# Patient Record
Sex: Female | Born: 1970 | Race: White | Hispanic: No | Marital: Married | Smoking: Never smoker
Health system: Southern US, Community
[De-identification: ages and names within clinical notes are randomized; demographics above are authoritative.]

## PROBLEM LIST (undated history)

## (undated) DIAGNOSIS — R7303 Prediabetes: Secondary | ICD-10-CM

## (undated) DIAGNOSIS — I639 Cerebral infarction, unspecified: Secondary | ICD-10-CM

## (undated) DIAGNOSIS — I10 Essential (primary) hypertension: Secondary | ICD-10-CM

## (undated) DIAGNOSIS — I1310 Hypertensive heart and chronic kidney disease without heart failure, with stage 1 through stage 4 chronic kidney disease, or unspecified chronic kidney disease: Secondary | ICD-10-CM

## (undated) DIAGNOSIS — I69959 Hemiplegia and hemiparesis following unspecified cerebrovascular disease affecting unspecified side: Secondary | ICD-10-CM

## (undated) DIAGNOSIS — G2581 Restless legs syndrome: Secondary | ICD-10-CM

## (undated) DIAGNOSIS — Z8679 Personal history of other diseases of the circulatory system: Secondary | ICD-10-CM

## (undated) DIAGNOSIS — F419 Anxiety disorder, unspecified: Secondary | ICD-10-CM

## (undated) DIAGNOSIS — E785 Hyperlipidemia, unspecified: Secondary | ICD-10-CM

## (undated) DIAGNOSIS — R269 Unspecified abnormalities of gait and mobility: Secondary | ICD-10-CM

## (undated) HISTORY — DX: Hypertensive heart and chronic kidney disease without heart failure, with stage 1 through stage 4 chronic kidney disease, or unspecified chronic kidney disease: I13.10

## (undated) HISTORY — DX: Hemiplegia and hemiparesis following unspecified cerebrovascular disease affecting unspecified side: I69.959

## (undated) HISTORY — DX: Cerebral infarction, unspecified: I63.9

## (undated) HISTORY — DX: Personal history of other diseases of the circulatory system: Z86.79

## (undated) HISTORY — DX: Restless legs syndrome: G25.81

## (undated) HISTORY — DX: Hyperlipidemia, unspecified: E78.5

## (undated) HISTORY — DX: Anxiety disorder, unspecified: F41.9

## (undated) HISTORY — PX: TENDON REPAIR: SHX5111

## (undated) HISTORY — DX: Unspecified abnormalities of gait and mobility: R26.9

## (undated) HISTORY — DX: Essential (primary) hypertension: I10

## (undated) HISTORY — DX: Prediabetes: R73.03

---

## 1999-09-27 ENCOUNTER — Other Ambulatory Visit: Admission: RE | Admit: 1999-09-27 | Discharge: 1999-09-27 | Payer: Self-pay | Admitting: Obstetrics and Gynecology

## 1999-12-20 ENCOUNTER — Ambulatory Visit (HOSPITAL_COMMUNITY): Admission: RE | Admit: 1999-12-20 | Discharge: 1999-12-20 | Payer: Self-pay | Admitting: Obstetrics and Gynecology

## 1999-12-20 ENCOUNTER — Encounter: Payer: Self-pay | Admitting: Obstetrics and Gynecology

## 2000-02-29 ENCOUNTER — Inpatient Hospital Stay (HOSPITAL_COMMUNITY): Admission: AD | Admit: 2000-02-29 | Discharge: 2000-02-29 | Payer: Self-pay | Admitting: Obstetrics and Gynecology

## 2000-03-24 ENCOUNTER — Inpatient Hospital Stay (HOSPITAL_COMMUNITY): Admission: AD | Admit: 2000-03-24 | Discharge: 2000-03-30 | Payer: Self-pay | Admitting: Obstetrics and Gynecology

## 2000-04-09 ENCOUNTER — Encounter: Admission: RE | Admit: 2000-04-09 | Discharge: 2000-05-27 | Payer: Self-pay | Admitting: Obstetrics and Gynecology

## 2000-05-07 ENCOUNTER — Other Ambulatory Visit: Admission: RE | Admit: 2000-05-07 | Discharge: 2000-05-07 | Payer: Self-pay | Admitting: Obstetrics and Gynecology

## 2001-01-14 ENCOUNTER — Encounter: Admission: RE | Admit: 2001-01-14 | Discharge: 2001-01-14 | Payer: Self-pay | Admitting: *Deleted

## 2001-01-14 ENCOUNTER — Encounter: Payer: Self-pay | Admitting: *Deleted

## 2001-04-06 ENCOUNTER — Encounter: Admission: RE | Admit: 2001-04-06 | Discharge: 2001-05-13 | Payer: Self-pay | Admitting: Internal Medicine

## 2001-04-15 ENCOUNTER — Other Ambulatory Visit: Admission: RE | Admit: 2001-04-15 | Discharge: 2001-04-15 | Payer: Self-pay | Admitting: Obstetrics & Gynecology

## 2001-12-11 ENCOUNTER — Encounter: Admission: RE | Admit: 2001-12-11 | Discharge: 2001-12-11 | Payer: Self-pay | Admitting: Obstetrics & Gynecology

## 2001-12-11 ENCOUNTER — Encounter: Payer: Self-pay | Admitting: Obstetrics & Gynecology

## 2002-12-28 ENCOUNTER — Other Ambulatory Visit: Admission: RE | Admit: 2002-12-28 | Discharge: 2002-12-28 | Payer: Self-pay | Admitting: Obstetrics & Gynecology

## 2003-02-09 ENCOUNTER — Inpatient Hospital Stay (HOSPITAL_COMMUNITY): Admission: AD | Admit: 2003-02-09 | Discharge: 2003-02-09 | Payer: Self-pay | Admitting: Obstetrics and Gynecology

## 2003-10-28 ENCOUNTER — Ambulatory Visit (HOSPITAL_COMMUNITY): Admission: RE | Admit: 2003-10-28 | Discharge: 2003-10-28 | Payer: Self-pay | Admitting: Pediatrics

## 2004-02-21 ENCOUNTER — Other Ambulatory Visit: Admission: RE | Admit: 2004-02-21 | Discharge: 2004-02-21 | Payer: Self-pay | Admitting: Obstetrics & Gynecology

## 2004-10-09 ENCOUNTER — Ambulatory Visit (HOSPITAL_COMMUNITY)
Admission: RE | Admit: 2004-10-09 | Discharge: 2004-10-09 | Payer: Self-pay | Admitting: Orthodontics and Dentofacial Orthopedics

## 2005-04-29 ENCOUNTER — Other Ambulatory Visit: Admission: RE | Admit: 2005-04-29 | Discharge: 2005-04-29 | Payer: Self-pay | Admitting: Obstetrics & Gynecology

## 2005-05-27 ENCOUNTER — Ambulatory Visit (HOSPITAL_COMMUNITY): Admission: RE | Admit: 2005-05-27 | Discharge: 2005-05-27 | Payer: Self-pay | Admitting: Gastroenterology

## 2005-07-02 ENCOUNTER — Ambulatory Visit (HOSPITAL_COMMUNITY): Admission: RE | Admit: 2005-07-02 | Discharge: 2005-07-02 | Payer: Self-pay | Admitting: Gastroenterology

## 2006-04-05 ENCOUNTER — Inpatient Hospital Stay (HOSPITAL_COMMUNITY): Admission: AD | Admit: 2006-04-05 | Discharge: 2006-04-05 | Payer: Self-pay | Admitting: Obstetrics and Gynecology

## 2006-04-28 ENCOUNTER — Inpatient Hospital Stay (HOSPITAL_COMMUNITY): Admission: AD | Admit: 2006-04-28 | Discharge: 2006-05-01 | Payer: Self-pay | Admitting: Obstetrics & Gynecology

## 2006-05-02 ENCOUNTER — Encounter: Admission: RE | Admit: 2006-05-02 | Discharge: 2006-05-28 | Payer: Self-pay | Admitting: Obstetrics & Gynecology

## 2007-04-02 ENCOUNTER — Inpatient Hospital Stay (HOSPITAL_COMMUNITY): Admission: AD | Admit: 2007-04-02 | Discharge: 2007-04-02 | Payer: Self-pay | Admitting: Obstetrics and Gynecology

## 2007-06-18 DIAGNOSIS — I639 Cerebral infarction, unspecified: Secondary | ICD-10-CM

## 2007-06-18 HISTORY — DX: Cerebral infarction, unspecified: I63.9

## 2007-10-05 ENCOUNTER — Encounter (INDEPENDENT_AMBULATORY_CARE_PROVIDER_SITE_OTHER): Payer: Self-pay | Admitting: Obstetrics and Gynecology

## 2007-10-05 ENCOUNTER — Inpatient Hospital Stay (HOSPITAL_COMMUNITY): Admission: AD | Admit: 2007-10-05 | Discharge: 2007-10-09 | Payer: Self-pay | Admitting: Obstetrics and Gynecology

## 2007-12-17 ENCOUNTER — Ambulatory Visit (HOSPITAL_COMMUNITY): Admission: RE | Admit: 2007-12-17 | Discharge: 2007-12-17 | Payer: Self-pay | Admitting: Internal Medicine

## 2009-06-21 ENCOUNTER — Encounter: Admission: RE | Admit: 2009-06-21 | Discharge: 2009-07-20 | Payer: Self-pay | Admitting: Neurology

## 2010-02-13 ENCOUNTER — Ambulatory Visit (HOSPITAL_COMMUNITY): Admission: RE | Admit: 2010-02-13 | Discharge: 2010-02-13 | Payer: Self-pay | Admitting: Internal Medicine

## 2010-07-08 ENCOUNTER — Encounter: Payer: Self-pay | Admitting: Orthodontics and Dentofacial Orthopedics

## 2010-10-30 NOTE — Op Note (Signed)
NAME:  Andrea Cameron, Andrea Cameron                ACCOUNT NO.:  [id number]   MEDICAL RECORD NO.:  [id number]          PATIENT TYPE:  INP   LOCATION:  9118                          FACILITY:  [facility]   PHYSICIAN:  Guy Sandifer. Henderson Cloud, M.D. DATE OF BIRTH:  1970/12/30   DATE OF PROCEDURE:  10/05/2007  DATE OF DISCHARGE:                               OPERATIVE REPORT   PREOPERATIVE DIAGNOSES:  1. Chronic hypertension.  2. Previous cesarean section.  3. Desires permanent sterilization.   POSTOPERATIVE DIAGNOSES:  1. Chronic hypertension.  2. Previous cesarean section.  3. Desires permanent sterilization.   PROCEDURE:  Repeat low transverse cesarean section and bilateral tubal  ligation.   SURGEON:  Guy Sandifer. Henderson Cloud, M.D.   ANESTHESIA:  Spinal; Laqueta Due, M.D.   ESTIMATED BLOOD LOSS:  800 mL.   FINDINGS:  Viable female infant, Apgars of 9 and 9 at one five minutes.  Birth weight 7 pounds 0 ounces.  Arterial cord pH is pending.   INDICATIONS AND CONSENT:  This patient is a 40 year old married white  female, G3 P2, with an EDC of Oct 21, 2007.  Prenatal care has been  complicated by:  1) Chronic hypertension, currently on labetalol 100 mg  p.o. b.i.d.; 2) History of stroke with right-sided partial paralysis; 3)  Depression on Cymbalta the first and second trimester; 4) Previous  cesarean section x2.  The patient was scheduled for repeat C-section on  October 08, 2007.  She is sent to [facility] for evaluation of  elevated blood pressures.  Blood pressures are 140s over 90s to low 100.  Laboratories were within normal limits.  Urine protein is negative.  The  baby is reactive.  However, blood pressures remain elevated after her  usual dose of labetalol.  She did have mild headache upon presentation,  although it did resolve during the evaluation.  No changes in vision.  No epigastric pain.  Because of elevated blood pressure, after  discussing options the recommendation is made for delivery.   Potential  risks and complications are discussed preoperatively including but not  limited to infection, bleeding requiring transfusion of blood products  with possible HIV and hepatitis acquisition, organ damage, DVT, PE,  pneumonia.  The patient also requests tubal ligation.  The permanence of  the procedure, failure rate and increased ectopic risk are also  reviewed.  All questions are answered and consent is signed on the  chart.   PROCEDURE:  The patient is taken to the operating room where spinal  anesthetic is placed.  She is placed in the dorsal supine position with  a 15-degree left lateral wedge.  PAS hose are applied.  She is prepped,  a Foley catheter is placed, the bladder is drained, and she is draped in  a sterile fashion.  After testing for adequate spinal anesthesia, the  skin is entered.  The dissection is carried out in layers to the  peritoneum.  The peritoneum is incised and extended superiorly and  inferiorly.  Vesicouterine peritoneum is taken down cephalad and  laterally.  Bladder flap is developed and the bladder blade  is placed.  The uterus is incised in a low transverse manner, and the uterine cavity  is entered bluntly with a hemostat.  The uterine incision is extended  cephalad and laterally with the fingers.  Artificial rupture of  membranes for clear fluid is carried out.  Vertex is delivered, and the  oropharynx and nasopharynx are suctioned.  Baby is then delivered and  good cry and tone is noted.  Cord is clamped and cut, and the baby is  handed to the awaiting pediatrics team.  The placenta is manually  delivered and sent to Pathology.  The uterine cavity is cleaned.  The  uterus is then closed in two running locking imbricating layers of 0  Monocryl suture which achieves good hemostasis.   Tubes and ovaries are normal bilaterally.  The left fallopian tube is  identified from cornu to fimbria.  It is grasped in its mid ampullary  portion with a  Babcock clamp.  A knuckle of tube is doubly ligated with  two free ties of 0 plain suture.  The intervening knuckle is then  sharply resected.  Cautery is used to assure complete hemostasis.  A  similar procedure is carried out on the right side.  The anterior  peritoneum is closed in running fashion with 0 Monocryl suture which is  also used to reapproximate the pyramidalis muscle in the midline.  The  anterior rectus fascia is closed in running fashion with 0 PDS suture.  Skin is closed with clips.  All sponge, instrument and needle counts are  correct, and the patient is transferred to recovery room in stable  condition.      Guy Sandifer Henderson Cloud, M.D.  Electronically Signed     JET/MEDQ  D:  10/05/2007  T:  10/05/2007  Job:  [id number]

## 2010-11-02 NOTE — Discharge Summary (Signed)
[facility]  Patient:    Andrea Cameron, Andrea Cameron                       MRN: [id number] Adm. Date:  [id number] Disc. Date: [id number] Attending:  Cordelia Pen Ii Dictator:   Danie Chandler, R.N.                           Discharge Summary  ADMISSION DIAGNOSES:          1. Intrauterine pregnancy at term.                               2. Pregnancy-induced hypertension.  DISCHARGE DIAGNOSES:          1. Intrauterine pregnancy at term.                               2. Failure to progress.                               3. Finding of breech presentation.                               3. Pregnancy-induced hypertension.  PROCEDURES:                   On March 27, 2000, primary low transverse cesarean section.  REASON FOR ADMISSION:         The patient is a 40 year old, primigravida, married, white female who presented at term with induction of labor due to pregnancy-induced hypertension.  The first day of induction she failed and the second day she progressed to approximately 7 cm mild dilatation with subsequent arrest despite adequate uterine activity.  The decision was made to proceed with a primary cesarean section.  HOSPITAL COURSE:              The patient was taken to the operating room and underwent the above-named procedure without complications.  This was productive of a viable female infant with Apgars of 8 at one minute and 9 at five minutes and an arterial cord pH of 7.29.  On the day of surgery, the patient had good control of pain.  Her blood pressures were 156-164/80-90 with no CNS signs or symptoms.  Her deep tendon reflexes were 1-2+ with no clonus. On postoperative day #1, the patient had a good return of bowel function and was tolerating a regular diet.  She remained without CNS signs or symptoms. Her blood pressures were 140/84 and 140/90.  Her hemoglobin on this day was 11.5, hematocrit 33.9, and white blood cell count 13.9.  On  postoperative day #3, the patients blood pressures were 142/90-157/100.  The hemoglobin was 11.5.  She was started on Procardia XL for her increased blood pressure.  She as discharged home on postoperative day #3.  She was ambulating well without difficulty.  Blood pressures were 147/90-128/82.  The patients blood pressure was stable on Procardia XL.  DISPOSITION:                  The patient was discharged home on March 30, 2000.  CONDITION ON DISCHARGE:       Good.  DIET:  Regular as tolerated.  ACTIVITY:                     No heavy lifting.  No driving.  No vaginal entry.  FOLLOW-UP:                    She is to follow up in the office in one to two weeks for an incision check.  She is to call for temperature greater than 100 degrees, persistent nausea or vomiting, heavy vaginal bleeding, and/or redness or drainage from the incision site, as well as any CNS signs or symptoms.  DISCHARGE MEDICATIONS:        1. Prenatal vitamins one p.o. q.d.                               2. Procardia XL 30 mg one every day.                               3. Percocet as directed by M.D.                               4. Motrin as directed by M.D. DD:  04/21/00 TD:  04/21/00 Job: [id number]

## 2010-11-02 NOTE — Op Note (Signed)
NAME:  Andrea Cameron, Andrea Cameron                ACCOUNT NO.:  [id number]   MEDICAL RECORD NO.:  [id number]          PATIENT TYPE:  AMB   LOCATION:  ENDO                         FACILITY:  [facility]   PHYSICIAN:  Shirley Friar, MDDATE OF BIRTH:  [birth year]   DATE OF PROCEDURE:  07/02/2005  DATE OF DISCHARGE:                                 OPERATIVE REPORT   PROCEDURE PERFORMED:  Colonoscopy.   ENDOSCOPIST:  Shirley Friar, MD   INDICATIONS FOR PROCEDURE:  Rectal bleeding.   MEDICATIONS:  Fentanyl 50 mcg IV, Versed 5 mg IV.   FINDINGS:  Sigmoidoscope was inserted and advanced to 50 cm where a large  amount of solid stool was present and therefore prevented further safe  passage of the sigmoidoscope.  On careful withdrawal, revealed normal  appearing mucosa from 50 cm down to the rectum.  Retroflexion revealed small  internal hemorrhoids, but otherwise the sigmoidoscopy was normal.   ASSESSMENT:  Small internal hemorrhoids, otherwise normal flexible  sigmoidoscopy.   PLAN:  1.  High fiber diet.  2.  Follow-up in my office as needed.      Shirley Friar, MD  Electronically Signed     VCS/MEDQ  D:  07/02/2005  T:  07/02/2005  Job:  [id number]   cc:   Freddy Finner, M.D.  Fax: [phone]   Lucky Cowboy, M.D.  Fax: [phone]

## 2010-11-02 NOTE — Op Note (Signed)
[facility]  Patient:    Andrea Cameron, Andrea Cameron                       MRN: [id number] Proc. Date: 03/27/00 Adm. Date:  [id number] Attending:  Cordelia Pen Ii                           Operative Report  PREOPERATIVE DIAGNOSIS:       Intrauterine pregnancy, at term with induction due to pregnancy-induced hypertension, failure to progress.  POSTOPERATIVE DIAGNOSIS:      Intrauterine pregnancy, at term with induction due to pregnancy-induced hypertension, failure to progress.  Finding of breech presentation.  OPERATION:                    Low transverse cesarean section.  SURGEON:                      Juluis Mire, M.D.  ANESTHESIA:                   Epidural.  ESTIMATED BLOOD LOSS:         800 cc.  PACKS AND DRAINS:             None.  BLOOD PRODUCTS:               None.  COMPLICATIONS:                None.  INDICATIONS:                  The patient is a 40 year old primigravida, married white female, presented at term with induction labor due to pregnancy-induced hypertension.  She failed first day of induction, second day she progressed to approximately 7 cm mid dilatation with subsequent arrest despite adequate uterine activity.  The decision was made to proceed with the primary cesarean section.  The risks were discussed including the risks of infection.  The risks of hemorrhage that could necessitate transfusion, the risks of AIDS or hepatitis.  The risks of injury to adjacent organs including bladder, bowel or ureters that could require further exploratory surgery.  The patient risk of deep vein thrombosis and pulmonary embolus.  DESCRIPTION OF PROCEDURE:     The patient was taken to the operating room and placed in the supine position with left lateral tilt.  After satisfactory level of epidural anesthesia was obtained, the abdomen was prepped out with Betadine and draped as a sterile field.  A low transverse skin incision was made  with a knife and carried and carried to the to the subcutaneous tissue. The anterior rectus fascia was entered sharply and incision and fascia extended laterally.  The fascia was taken off the muscle superiorly and inferiorly.  The rectus muscles were separated in the midline.  Anterior peritoneum was entered sharply and incision to the peritoneum extended both superiorly and inferiorly.  A low transverse bladder flap was developed.  The low transverse uterine incision was begun with the knife and extended laterally using manual traction.  The infant did present at this time in the breech presentation and was delivered in the usual manner.  The infant was a viable female whose weight is still pending.  Apgars were 8/9.  Umbilical ______ pH was 7.29.  The placenta was then delivered manually.  The uterus wiped free of remaining membranes in the placenta.  Using closed  running locking suture of 0 chromic using a two layer closure technique.  Hemostasis was excellent.  Tubes and ovaries were visualized and noted to be unremarkable.  Muscle reapproximated with a running suture of 3-0 Vicryl.  The fascia closed with a running suture of 0 PDS.  The skin was closed with staples and Steri-Strips.  Sponge, needle, and instrument counts reported as correct by the circulating nurse x 2.  Foley catheter remained clear at the time of closure.  The patient tolerated the procedure well and was returned to the recovery room in good condition. DD:  03/27/00 TD:  03/28/00 Job: [id number]

## 2010-11-02 NOTE — Op Note (Signed)
NAME:  Andrea Cameron, Andrea Cameron NO.:  [id number]   MEDICAL RECORD NO.:  [id number]          PATIENT TYPE:  INP   LOCATION:  9199                          FACILITY:  [facility]   PHYSICIAN:  Freddy Finner, M.D.   DATE OF BIRTH:  December 15, 1970   DATE OF PROCEDURE:  04/28/2006  DATE OF DISCHARGE:                                 OPERATIVE REPORT   PREOPERATIVE DIAGNOSES:  1. Intrauterine pregnancy at 37-1/[redacted] weeks gestation.  2. 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   POSTOPERATIVE DIAGNOSES:  1. Intrauterine pregnancy at 37-1/[redacted] weeks gestation.  2. 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  3. Patient in labor.   PROCEDURE:  Repeat low transverse cesarean section with delivery of viable  female infant, Apgars of 9 and 9.  Arterial cord pH of 7.33, intraoperative  finding of triple nuchal cord.   SURGEON:  Freddy Finner, M.D.   ANESTHESIA:  Spinal.   ESTIMATED INTRAOPERATIVE BLOOD LOSS:  Less than or equal to 700 mL.   COMPLICATIONS:  None.   SECONDARY FINDING:  Tiny subserosal myomas x3 all measuring less than 6 mm  and left in place.   Patient is a 40 year old who was scheduled for cesarean delivery on May 05, 2006.  She presented this morning in early labor.  Based on that, we  elected to proceed with delivery at this time.  She was taken to the  operating room and there placed under adequate spinal anesthesia, placed in  the dorsal recumbent position with elevation of the right hip by 15 degrees.  The abdomen was prepped and draped in the usual fashion and Foley catheter  was placed using sterile technique.  Sterile drapes were applied.  A lower  abdominal transverse incision was made through an old scar and carried  sharply down to fascia.  Fascia was entered sharply and extended to the  extent of the skin incision.  Rectus sheath was developed  superiorly and  inferiorly with blunt and sharp dissection.  Rectus muscles were divided in  the midline.  Peritoneum was entered sharply and extended to the extent of  the skin incision with blunt dissection.  Bladder blade was placed.  Transverse incision was made in the visceral peritoneum overlying the lower  uterine segment and the bladder bluntly dissected off the lower segment.  Transverse incision was made in the lower uterine segment and extended  bluntly.  Amniotic fluid was clear.  The infant was then delivered without  difficulty with femoral compression.  Three loose loops of nuchal cord were  reduced before delivery of the shoulders.  Apgars and cord pH are noted  above.  Birth weight is unknown to me at the time of dictation.  Placenta  and other products of conception removed from the uterus after collection of  retained venous and arterial blood samples.  Uterus was delivered onto the  anterior abdominal wall.  With the exception of very small subserosal  myomas, the uterus was normal.  Tubes and ovaries were normal.  Cavity was  completely evacuated which was confirmed by manual exploration of the  uterine cavity.  Uterine incision was then closed in a double layer, running  locking 0 Monocryl was used for the first layer and an imbricating suture of  0 Monocryl for the second layer.  Bladder flap was reapproximated with an  interrupted 0 Monocryl suture.  The uterus was delivered back into the  abdominal cavity, irrigation was carried out.  Some small superficial  bleeding sources at the inferior portion of the incision were bovied with  adequate and complete hemostasis.  All pack, needle and instrument counts  were correct.  After irrigation, the abdominal incision was closed in  layers. Running 0 Monocryl was used to reapproximate the peritoneum and to  reapproximate the rectus muscles.  Fascia was closed with running 0 PDS  running from angle to angle on each side.   Bleeding subcutaneous vessels  were controlled with the Bovie.  Subcutaneous tissue was approximated with a  running 2-0 plain suture.  The skin was closed with skin staples.  Sterile  dressing was applied.  The patient was taken to the recovery room in good  condition.      Freddy Finner, M.D.  Electronically Signed     WRN/MEDQ  D:  04/28/2006  T:  04/28/2006  Job:  [id number]

## 2010-11-02 NOTE — Discharge Summary (Signed)
NAME:  Andrea Cameron, Andrea Cameron                ACCOUNT NO.:  [id number]   MEDICAL RECORD NO.:  [id number]          PATIENT TYPE:  INP   LOCATION:  9118                          FACILITY:  [facility]   PHYSICIAN:  Duke Salvia. Marcelle Overlie, M.D.DATE OF BIRTH:  09/15/1970   DATE OF ADMISSION:  10/05/2007  DATE OF DISCHARGE:  10/09/2007                               DISCHARGE SUMMARY   ADMITTING DIAGNOSES:  1. Intrauterine pregnancy at 37-5/7th weeks estimated gestational age.  2. Chronic hypertension, currently on labetalol.  3. Previous cesarean section x2.  4. History of stroke with right-sided paralysis.  5. Multiparity, desires permanent sterilization.   DISCHARGE DIAGNOSES:  1. Status post low-transverse cesarean section.  2. Viable female infant.  3. Chronic hypertension.  4. Permanent sterilization.   REASON FOR ADMISSION:  Please see written H and P.   HOSPITAL COURSE:  The patient is a 40 year old white married female  gravida 3, para 2 that was admitted to [facility] for  observation.  The patient had known chronic hypertension, currently on  labetalol.  The patient did have a history of a stroke with right-sided  partial paralysis.  The patient was scheduled for a cesarean delivery in  approximately 3 days.  However, the patient was seen in the office and  was noted to have elevation in blood pressure with blood pressure  142s/90s to below 100s.  Laboratory findings were within normal limits  and urine protein was negative.  Baby was noted to be reactive, however,  blood pressure continued to be elevated after the usual dose of  labetalol.  The patient did have a mild headache upon presentation.  She  denied any changes in vision or epigastric pain.  Due to the elevation  in blood pressure, decision was made to proceed with a repeat low-  transverse cesarean section.  The patient was then taken to the  operating room where spinal anesthesia was administered without  difficulty.  A low-transverse incision was made with delivery of a  viable female infant weighing 7 pounds 0 ounces and Apgars of 9 at 1  minute and 9 at 5 minutes.  Bilateral tubal ligation was performed  without difficulty.  The patient tolerated the procedure well and was  taken to the recovery room in stable condition.  On postoperative day  #1, the patient complained of a mild frontal headache.  She denied any  blurred vision or right upper quadrant pain.  Vital signs were stable.  She was afebrile.  Blood pressure was 134/86 to 154/98.  Deep tendon  reflexes were 3+, no clonus.  No pitting edema was noted.  Abdomen was  soft with good return of bowel function.  The patient denied any right  upper quadrant tenderness.  Fundus was firm and nontender.  Abdominal  dressing were noted to be clean, dry, and intact.  Foley was noted to  have 250 mL of urine output over the last 8 hours.  Laboratory findings  revealed hemoglobin of 8.8, platelet count of 276,000, WBC count of  16.6, potassium was 3.7.  Liver function tests on the  20th revealed AST  of 21, ALT of 19, alkaline phosphatase 105, and uric acid was 4.8.  Due  to low urine output, fluids were changed to LR and was increased to 150  mL an hour.  PIH labs were ordered.  Repeat blood pressure was noted to  be 156/103 and hydrochlorothiazide 25 mg was initiated.  On  postoperative day #2, the patient denied headache or blurred vision or  right upper quadrant pain.  She did have some dizziness on the previous  day x2.  Vital signs were stable.  Blood pressure was now 143/105 to  141/107.  Deep tendon reflexes were 2+, no clonus, no pedal edema was  observed.  Abdomen soft.  Fundus firm and nontender.  Incision was  clean, dry, and intact.  Staples were intact.  Laboratory findings  revealed hemoglobin of 8.6, platelet count of 238,000, WBC count of  15.6, AST was 24, ALT was 26, alkaline phosphatase 72, and uric acid was  5.2.  The  patient was increased on the labetalol up to 200 mg one p.o.  b.i.d., continue hydrochlorothiazide 1 p.o. daily, and ferrous sulfate  was started b.i.d. with meals.  On postoperative day #3, the patient did  complain of some dizziness.  She denied headache.  Blood pressure was  138/106, 138/97, 149/103, and 146/98.  Abdomen soft.  Fundus firm and  nontender.  Incision was clean, dry, and intact.  The patient was now  increased on labetalol up to 300 mg 3 times a day.  She is continued on  hydrochlorothiazide.  On postoperative day #4, the patient denied any  complaints.  Vital signs were stable.  Blood pressure was 129/87 to  134/72.  Abdomen soft.  Fundus firm and nontender.  Incision was clean,  dry, and intact.  Staples were removed and the patient was later  discharged home.   CONDITION ON DISCHARGE:  Stable.   DIET:  Regular as tolerated.   ACTIVITY:  No heavy lifting, no driving x2 weeks, no vaginal entry.   FOLLOWUP:  The patient is to follow up in the office in 1 week for  incision check and blood pressure check.  She is to call for temperature  greater than 100 degrees, persistent nausea and vomiting, heavy vaginal  bleeding, and/or redness or drainage from the incisional site.  The  patient was also instructed to call for headache, blurred vision,  dizziness, or right upper quadrant pain.   DISCHARGE MEDICATIONS:  1. Labetalol 300 mg 1 p.o. t.i.d.  2. Percocet 5/325 #30 one p.o. every 4 hours as needed for pain.  3. Motrin 600 mg every 6 hours p.r.n.  4. Hydrochlorothiazide 25 mg 1 p.o. daily.      Julio Sicks, N.P.      Richard M. Marcelle Overlie, M.D.  Electronically Signed    CC/MEDQ  D:  11/01/2007  T:  11/02/2007  Job:  [id number]

## 2010-11-02 NOTE — Discharge Summary (Signed)
NAME:  Andrea Cameron, Andrea Cameron                ACCOUNT NO.:  [id number]   MEDICAL RECORD NO.:  [id number]          PATIENT TYPE:  INP   LOCATION:  9124                          FACILITY:  [facility]   PHYSICIAN:  Michelle L. Grewal, M.D.DATE OF BIRTH:  23-Feb-1971   DATE OF ADMISSION:  04/28/2006  DATE OF DISCHARGE:  05/01/2006                               DISCHARGE SUMMARY   ADMITTING DIAGNOSES:  1. Intrauterine pregnancy at term.  2. Previous cesarean section, desires repeat.  3. Spontaneous onset of labor.   DISCHARGE DIAGNOSES:  1. Status post low transverse cesarean section.  2. Viable female infant.   PROCEDURE:  Repeat low transverse cesarean section.   REASON FOR ADMISSION:  Please see written H&P.   HOSPITAL COURSE:  The patient is 40 year old white married female  gravida 2, para 1 that presented to [facility] at 37-  5/7th weeks with spontaneous onset of labor.  The patient had had a  previous cesarean section for cephalopelvic disproportion and desired  repeat.  On admission, vital signs were stable.  The patient did have  chronic hypertension and currently on labetalol with blood pressure  136/98.  PIH labs had been drawn two weeks prior to admission which were  within normal limits.  The patient was then taken to the operating room  where spinal anesthesia was administered without difficulty.  A low  transverse incision was made with delivery of a viable female infant  weighing 6 pounds 15 ounces.  Apgar's at 9 at one minute and  9 at five  minutes.  Arterial cord pH was 7.33.  The patient tolerated the  procedure well and was taken to the recovery room in stable condition.  Later that day, the patient did have some elevation of blood pressure  and labetalol was now increased to 200 mg stat and then restart 100 mg  b.i.d.  After approximately one-half hour, blood pressure was recheck  with blood pressure 130/85 with good urine output.  On the following  morning, the  patient denied complaint.  She had no evidence of blurred  vision or epigastric pain or headache.  Vital signs were stable.  She is  afebrile.  Blood pressure current was 111/67.  Abdomen soft with good  return of bowel function.  Fundus is firm and nontender.  Abdominal  dressing was noted to be clean, dry and intact.  Laboratory findings  revealed hemoglobin of 9.3, platelet count of 225,000, WBC of 9.2.   On postoperative day #2, the patient was without complaint.  Vital signs  remained stable.  She is afebrile.  Fundus firm and nontender.  Abdominal dressing had been removed revealing an incision that was  clean, dry and intact.   On postoperative day #3, the patient was without complaint.  She did  desire discharge.  Vital signs were stable.  She was afebrile.  Fundus  firm and nontender.  Incision was clean, dry and intact.  Staples were  removed and the patient was discharged home.   CONDITION ON DISCHARGE:  Good.   DIET:  Regular as tolerated.  ACTIVITY:  No heavy lifting, no driving x2 weeks, no vaginal entry.   FOLLOW UP:  The patient to follow up in the office in one week for an  incision check.  She is to call for temperature greater than 100  degrees, persistent nausea, vomiting, heavy vaginal bleeding and/or  redness or drainage from the incisional site.  The patient was also  instructed to call for headache, blurred vision or right upper quadrant  pain unrelieved with Tylenol.   DISCHARGE MEDICATIONS:  1. Ibuprofen 600 mg every 6 hours.  2. Tylox one to two every 4-6 hours.  3. Cymbalta 30 mg one p.o. daily.  4. Labetalol 100 mg one p.o. b.i.d.      Julio Sicks, N.P.      Stann Mainland. Vincente Poli, M.D.  Electronically Signed    CC/MEDQ  D:  05/19/2006  T:  05/19/2006  Job:  [id number]

## 2010-11-28 ENCOUNTER — Other Ambulatory Visit: Payer: Self-pay | Admitting: Internal Medicine

## 2010-11-28 DIAGNOSIS — N644 Mastodynia: Secondary | ICD-10-CM

## 2010-12-04 ENCOUNTER — Ambulatory Visit
Admission: RE | Admit: 2010-12-04 | Discharge: 2010-12-04 | Disposition: A | Discharge status: Home / Self Care | Payer: 59 | Source: Ambulatory Visit | Attending: Internal Medicine | Admitting: Internal Medicine

## 2010-12-04 DIAGNOSIS — N644 Mastodynia: Secondary | ICD-10-CM

## 2011-03-12 LAB — COMPREHENSIVE METABOLIC PANEL
ALT: 26
AST: 21
AST: 24
Albumin: 2.5 — ABNORMAL LOW
Alkaline Phosphatase: 105
Alkaline Phosphatase: 72
CO2: 22
Calcium: 8.6
Chloride: 103
Creatinine, Ser: 0.38 — ABNORMAL LOW
Creatinine, Ser: 0.47
GFR calc Af Amer: >60
GFR calc Af Amer: >60
GFR calc non Af Amer: >60
Potassium: 3.4 — ABNORMAL LOW
Potassium: 3.7
Sodium: 133 — ABNORMAL LOW
Total Bilirubin: 0.4
Total Protein: 5.7 — ABNORMAL LOW

## 2011-03-12 LAB — URINALYSIS, ROUTINE W REFLEX MICROSCOPIC
Nitrite: NEGATIVE
Urobilinogen, UA: 0.2

## 2011-03-12 LAB — CBC
HCT: 25.2 — ABNORMAL LOW
HCT: 33.1 — ABNORMAL LOW
Hemoglobin: 11.4 — ABNORMAL LOW
MCHC: 34.9
MCHC: 35
MCV: 77.6 — ABNORMAL LOW
Platelets: 238
RBC: 3.17 — ABNORMAL LOW
RBC: 3.21 — ABNORMAL LOW
WBC: 16.6 — ABNORMAL HIGH

## 2011-03-12 LAB — RPR: RPR Ser Ql: NONREACTIVE

## 2011-03-27 LAB — HEMOGLOBIN AND HEMATOCRIT, BLOOD
HCT: 38.6
Hemoglobin: 13.1

## 2012-10-13 ENCOUNTER — Telehealth: Payer: Self-pay | Admitting: Neurology

## 2012-10-13 NOTE — Telephone Encounter (Signed)
Patient left message to get and appointment scheduled.  She was last seen 11-21-10 by Dr. Terrace Arabia.

## 2012-10-14 NOTE — Telephone Encounter (Signed)
This encounter was created in error - please disregard.

## 2012-10-26 ENCOUNTER — Encounter: Payer: Self-pay | Admitting: Neurology

## 2012-10-26 ENCOUNTER — Ambulatory Visit (INDEPENDENT_AMBULATORY_CARE_PROVIDER_SITE_OTHER): Payer: Medicare Other | Admitting: Neurology

## 2012-10-26 VITALS — Ht 64.75 in | Wt 213.0 lb

## 2012-10-26 DIAGNOSIS — R269 Unspecified abnormalities of gait and mobility: Secondary | ICD-10-CM

## 2012-10-26 DIAGNOSIS — I69959 Hemiplegia and hemiparesis following unspecified cerebrovascular disease affecting unspecified side: Secondary | ICD-10-CM

## 2012-10-26 DIAGNOSIS — I1310 Hypertensive heart and chronic kidney disease without heart failure, with stage 1 through stage 4 chronic kidney disease, or unspecified chronic kidney disease: Secondary | ICD-10-CM

## 2012-10-26 DIAGNOSIS — G8110 Spastic hemiplegia affecting unspecified side: Secondary | ICD-10-CM

## 2012-10-26 MED ORDER — ONABOTULINUMTOXINA 100 UNITS IJ SOLR: 400.0000 [IU] | Freq: Once | INTRAMUSCULAR | Status: DC

## 2012-10-28 NOTE — Progress Notes (Signed)
  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 Last injection was in June 2012. The delayed treatment was due to her insurance coverage  She had great benefit from previous injection,   she complains worsening right hand spasticity, thumb in position, right toes flexion spasticity, painful to walk on it,   She wants to focus the treatment on her right lower extremity today  Neurologic Exam  Cardiac: Regular rhythm Pulmonary: clear to auscultation bilaterally  Mental Status: Pleasant, no apparent aphasia Cranial Nerves: mild right lower face weakness. Motor: spastic right hemiparesis, right thumb in position, pronation, shoulder anterior rotation, right spastic hemicircumferential gait, right toe flexion, mild anlke plantar inversion   Assessment and Plan: EMG guided Botox injection for right hemiparesis following stroke at age 49, 29 years ago.  100 units of Botox was dissolved into 2 cc of normal saline, total of 400 unit of Botox was used.  Right adductor longus 100 units Right adductor magnus 75 units  Right flexor digitorum longus 50 units. Right tibialis posterior 50 units  Right medial gastrocnemius 50 units  Right flexor digitorum brevis 50 units Right flexor hallucis longus 25 units     She will return to clinic in 3 months for repeat injection

## 2013-02-01 ENCOUNTER — Encounter: Payer: Self-pay | Admitting: Neurology

## 2013-02-01 ENCOUNTER — Ambulatory Visit (INDEPENDENT_AMBULATORY_CARE_PROVIDER_SITE_OTHER): Payer: Medicare Other | Admitting: Neurology

## 2013-02-01 VITALS — Ht 64.0 in | Wt 212.0 lb

## 2013-02-01 DIAGNOSIS — R269 Unspecified abnormalities of gait and mobility: Secondary | ICD-10-CM

## 2013-02-01 DIAGNOSIS — G8110 Spastic hemiplegia affecting unspecified side: Secondary | ICD-10-CM

## 2013-02-01 DIAGNOSIS — I69959 Hemiplegia and hemiparesis following unspecified cerebrovascular disease affecting unspecified side: Secondary | ICD-10-CM

## 2013-02-01 MED ORDER — ONABOTULINUMTOXINA 100 UNITS IJ SOLR
600.0000 [IU] | Freq: Once | INTRAMUSCULAR | Status: AC
  Administered 2013-02-01: 600 [IU] via INTRAMUSCULAR

## 2013-02-01 NOTE — Progress Notes (Signed)
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 Last injection was in June 2012. The delayed treatment was due to her insurance coverage  She had great benefit from previous injection,   she complains worsening right hand spasticity, thumb in position, right toes flexion spasticity, painful to walk on it,   UPDATE August 2014: Last injection was in Oct 28, 2012, it was focus on her right leg, she reported 20% improvement, there was no significant side effect, today she also wants some injection to her right upper extremity as well, because of  forceful right elbow pronation, finger flexions  Neurologic Exam  Cardiac: Regular rhythm Pulmonary: clear to auscultation bilaterally  Mental Status: Pleasant, no apparent aphasia Cranial Nerves: mild right lower face weakness. Motor: spastic right hemiparesis, right thumb in position,finger flexions,  pronation, shoulder anterior rotation, right spastic hemicircumferential gait, right toe flexion, mild anlke plantar inversion   Assessment and Plan: EMG guided Botox injection for right hemiparesis following stroke at age 42, 29 years ago.  100 units of Botox was dissolved into 2 cc of normal saline, total of  600 unit of Botox was used. Lot Number [id number], Exp Feb 2017  Right flexor digitorum longus 50 unitsx2=100 units. Right tibialis posterior 50 units x2=100 units Right medial gastrocnemius 50 units x2=100 units  Right flexor digitorum brevis 50 units Right flexor hallucis longus 25 units  Right extensor hallucis longus 25 units  Right brachialis 50 units Right pronator teres 25 units. Right Flexor digitorum profundi 75 units. Right flexor digitorum superficialis 50 units    She will return to clinic in 3 months for repeat injection

## 2013-08-26 ENCOUNTER — Other Ambulatory Visit: Payer: Self-pay | Admitting: Emergency Medicine

## 2013-09-01 NOTE — Telephone Encounter (Signed)
LMOM TO CALL  

## 2013-09-07 DIAGNOSIS — R7303 Prediabetes: Secondary | ICD-10-CM | POA: Insufficient documentation

## 2013-09-07 DIAGNOSIS — E785 Hyperlipidemia, unspecified: Secondary | ICD-10-CM | POA: Insufficient documentation

## 2013-09-07 DIAGNOSIS — I639 Cerebral infarction, unspecified: Secondary | ICD-10-CM | POA: Insufficient documentation

## 2013-09-07 DIAGNOSIS — F419 Anxiety disorder, unspecified: Secondary | ICD-10-CM | POA: Insufficient documentation

## 2013-09-08 ENCOUNTER — Ambulatory Visit: Payer: Self-pay | Admitting: Emergency Medicine

## 2013-09-15 ENCOUNTER — Encounter: Payer: Self-pay | Admitting: Physician Assistant

## 2013-09-15 ENCOUNTER — Ambulatory Visit (INDEPENDENT_AMBULATORY_CARE_PROVIDER_SITE_OTHER): Payer: Medicare Other | Admitting: Physician Assistant

## 2013-09-15 VITALS — BP 120/72 | HR 72 | Temp 97.7°F | Resp 16 | Ht 64.0 in | Wt 219.0 lb

## 2013-09-15 DIAGNOSIS — R7309 Other abnormal glucose: Secondary | ICD-10-CM

## 2013-09-15 DIAGNOSIS — R7303 Prediabetes: Secondary | ICD-10-CM

## 2013-09-15 DIAGNOSIS — E785 Hyperlipidemia, unspecified: Secondary | ICD-10-CM

## 2013-09-15 DIAGNOSIS — K219 Gastro-esophageal reflux disease without esophagitis: Secondary | ICD-10-CM

## 2013-09-15 DIAGNOSIS — R21 Rash and other nonspecific skin eruption: Secondary | ICD-10-CM

## 2013-09-15 MED ORDER — ALPRAZOLAM 0.5 MG PO TABS: 0.2500 mg | ORAL_TABLET | ORAL | Status: AC | PRN

## 2013-09-15 MED ORDER — ATENOLOL 100 MG PO TABS: ORAL_TABLET | ORAL | Status: DC

## 2013-09-15 MED ORDER — CITALOPRAM HYDROBROMIDE 40 MG PO TABS: ORAL_TABLET | ORAL | Status: DC

## 2013-09-15 MED ORDER — ROPINIROLE HCL 1 MG PO TABS: 1.0000 mg | ORAL_TABLET | Freq: Three times a day (TID) | ORAL | Status: DC

## 2013-09-15 MED ORDER — KETOCONAZOLE 2 % EX CREA: 1.0000 "application " | TOPICAL_CREAM | Freq: Two times a day (BID) | CUTANEOUS | Status: DC

## 2013-09-15 NOTE — Progress Notes (Signed)
   Subjective:    Patient ID: Andrea Cameron, female    DOB: 1970-08-02, 43 y.o.   MRN: [id number]  HPI 43 y.o. female presents for RX follow up. She has a history of noncompliance and was dismissed from the practice on 09/09/2013, she is here today before she has found another physician and we are happy to see her for 30 days until that happens. She complains of a rash under left arm and AB, has been on and off for years, worse summer, worse with sun. She also states she has had abdominal pain with greasy foods, denies fever, chills, melana, diarrhea, contipation.  She would like a 90 day supply of her medications. She only takes the xanax once a week or so, only if she has any stress.    Review of Systems  Constitutional: Negative.   HENT: Negative.   Respiratory: Negative.   Cardiovascular: Negative.   Gastrointestinal: Positive for abdominal pain (with greasy food epigastric and LLQ. ). Negative for nausea, vomiting, diarrhea, constipation, blood in stool and rectal pain.  Genitourinary: Negative.   Musculoskeletal: Negative.   Skin: Positive for rash.       Objective:   Physical Exam  Constitutional: She is oriented to person, place, and time. She appears well-developed and well-nourished.  HENT:  Head: Normocephalic and atraumatic.  Eyes: Conjunctivae and EOM are normal. Pupils are equal, round, and reactive to light.  Neck: Normal range of motion. Neck supple.  Cardiovascular: Normal rate and regular rhythm.   Abdominal: Soft. She exhibits no mass. There is tenderness (epigastric). There is no rebound and no guarding.  Musculoskeletal: Normal range of motion.  Neurological: She is alert and oriented to person, place, and time.  Skin: Skin is warm and dry. Rash (dark raised smooth velvet patches along stomach, left axilla, and neck) noted.        Assessment & Plan:  Medication refill- will give 90 day and patient is encouraged to find primary care  provider GERD/Gallbladder- decrease fatty foods, offered PPI/ or ultrasound but she declined.  Rash? Tinea versicolor versus acanthis nigrans- Ketoconazole 2% cream called in and weight loss advised

## 2013-09-15 NOTE — Patient Instructions (Signed)

## 2014-01-15 ENCOUNTER — Other Ambulatory Visit: Payer: Self-pay | Admitting: Physician Assistant

## 2014-04-21 ENCOUNTER — Other Ambulatory Visit: Payer: Self-pay | Admitting: Physician Assistant

## 2014-12-02 ENCOUNTER — Other Ambulatory Visit: Payer: Self-pay | Admitting: Family Medicine

## 2014-12-02 DIAGNOSIS — Z1231 Encounter for screening mammogram for malignant neoplasm of breast: Secondary | ICD-10-CM

## 2015-07-11 ENCOUNTER — Other Ambulatory Visit: Payer: Self-pay | Admitting: Family Medicine

## 2015-07-11 DIAGNOSIS — Z1231 Encounter for screening mammogram for malignant neoplasm of breast: Secondary | ICD-10-CM

## 2015-07-11 DIAGNOSIS — Z1239 Encounter for other screening for malignant neoplasm of breast: Secondary | ICD-10-CM

## 2015-11-30 ENCOUNTER — Emergency Department (HOSPITAL_COMMUNITY)
Admission: EM | Admit: 2015-11-30 | Discharge: 2015-11-30 | Disposition: A | Discharge status: Home / Self Care | Payer: Medicare Other | Attending: Emergency Medicine | Admitting: Emergency Medicine

## 2015-11-30 ENCOUNTER — Encounter (HOSPITAL_COMMUNITY): Payer: Self-pay | Admitting: Emergency Medicine

## 2015-11-30 ENCOUNTER — Emergency Department (HOSPITAL_COMMUNITY): Payer: Medicare Other

## 2015-11-30 DIAGNOSIS — Y999 Unspecified external cause status: Secondary | ICD-10-CM | POA: Diagnosis not present

## 2015-11-30 DIAGNOSIS — Z79899 Other long term (current) drug therapy: Secondary | ICD-10-CM | POA: Diagnosis not present

## 2015-11-30 DIAGNOSIS — S92301A Fracture of unspecified metatarsal bone(s), right foot, initial encounter for closed fracture: Secondary | ICD-10-CM

## 2015-11-30 DIAGNOSIS — Y92009 Unspecified place in unspecified non-institutional (private) residence as the place of occurrence of the external cause: Secondary | ICD-10-CM | POA: Diagnosis not present

## 2015-11-30 DIAGNOSIS — Z8673 Personal history of transient ischemic attack (TIA), and cerebral infarction without residual deficits: Secondary | ICD-10-CM | POA: Diagnosis not present

## 2015-11-30 DIAGNOSIS — W1830XA Fall on same level, unspecified, initial encounter: Secondary | ICD-10-CM | POA: Insufficient documentation

## 2015-11-30 DIAGNOSIS — M545 Low back pain, unspecified: Secondary | ICD-10-CM

## 2015-11-30 DIAGNOSIS — S99921A Unspecified injury of right foot, initial encounter: Secondary | ICD-10-CM | POA: Diagnosis present

## 2015-11-30 DIAGNOSIS — E785 Hyperlipidemia, unspecified: Secondary | ICD-10-CM | POA: Insufficient documentation

## 2015-11-30 DIAGNOSIS — Z7982 Long term (current) use of aspirin: Secondary | ICD-10-CM | POA: Insufficient documentation

## 2015-11-30 DIAGNOSIS — Y9301 Activity, walking, marching and hiking: Secondary | ICD-10-CM | POA: Insufficient documentation

## 2015-11-30 DIAGNOSIS — S92354A Nondisplaced fracture of fifth metatarsal bone, right foot, initial encounter for closed fracture: Secondary | ICD-10-CM | POA: Insufficient documentation

## 2015-11-30 DIAGNOSIS — I1310 Hypertensive heart and chronic kidney disease without heart failure, with stage 1 through stage 4 chronic kidney disease, or unspecified chronic kidney disease: Secondary | ICD-10-CM | POA: Insufficient documentation

## 2015-11-30 DIAGNOSIS — N189 Chronic kidney disease, unspecified: Secondary | ICD-10-CM | POA: Insufficient documentation

## 2015-11-30 MED ORDER — DIAZEPAM 5 MG PO TABS
5.0000 mg | ORAL_TABLET | Freq: Once | ORAL | Status: AC
  Administered 2015-11-30: 5 mg via ORAL
  Filled 2015-11-30: qty 1

## 2015-11-30 MED ORDER — OXYCODONE-ACETAMINOPHEN 5-325 MG PO TABS
1.0000 | ORAL_TABLET | Freq: Once | ORAL | Status: AC
  Administered 2015-11-30: 1 via ORAL
  Filled 2015-11-30: qty 1

## 2015-11-30 MED ORDER — DIAZEPAM 5 MG PO TABS: 5.0000 mg | ORAL_TABLET | Freq: Two times a day (BID) | ORAL | Status: DC

## 2015-11-30 MED ORDER — OXYCODONE-ACETAMINOPHEN 5-325 MG PO TABS
2.0000 | ORAL_TABLET | ORAL | Status: DC | PRN
Start: 2015-11-30 — End: 2018-12-03

## 2015-11-30 MED ORDER — IBUPROFEN 600 MG PO TABS: 600.0000 mg | ORAL_TABLET | Freq: Four times a day (QID) | ORAL | Status: DC | PRN

## 2015-11-30 NOTE — ED Provider Notes (Signed)
CSN: [id number]     Arrival date & time 11/30/15  1005 History   First MD Initiated Contact with Patient 11/30/15 1425     Chief Complaint  Patient presents with  . Fall     (Consider location/radiation/quality/duration/timing/severity/associated sxs/prior Treatment) HPI   Patient is a 45 year old female with past medical history of hypertension and CVA who presents the ED with complaint of fall, onset yesterday evening. Patient reports over the past 3 months she has had back spasms which she notes is being managed by her PCP. She states she has been taking Xanaflex for her spasms which she notes make her "loopy". Patient reports taking her Xanaflex yesterday and notes when she was walking into her bedroom she lost balance resulting in her twisting her right foot and falling on the ground. Denies head injury or LOC. Patient reports since the fall she has had worsening lower back pain. Patient reports having constant spasming pain to bilateral lower back which she notes has been consistent with pain she has had over the past few months but notes it is worse. She endorses mild relief with taking Xanaflex at home. Patient reports having baseline right-sided weakness secondary to a stroke she had when she was 45 years old however she denies using any assistive devices while ambulating. Patient endorses having pain to her right ankle and reports being unable to bear weight on her right foot due to pain. Endorses associated swelling and bruising to her right foot. Pt denies fever, numbness, tingling, saddle anesthesia, loss of bowel or bladder, abdominal pain, urinary retention, weakness, IVDU, cancer or recent spinal manipulation.   Past Medical History  Diagnosis Date  . Abnormality of gait   . 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 Hemiplegia affecting dominant side, late effect of cerebrovascular  disease   . Hyperlipidemia   . Hypertension   . Anxiety   . Prediabetes   . RLS (restless legs syndrome)   . CVA (cerebral infarction) 2009   Past Surgical History  Procedure Laterality Date  . Cesarean section      x3  . Tendon repair      Right arm   Family History  Problem Relation Age of Onset  . High blood pressure    . Depression    . Anxiety disorder    . Seizures    . Stroke     Social History  Substance Use Topics  . Smoking status: Never Smoker   . Smokeless tobacco: Never Used  . Alcohol Use: 0.6 oz/week    1 Glasses of wine per week     Comment: OCC   OB History    No data available     Review of Systems  Musculoskeletal: Positive for back pain, joint swelling (right foot) and arthralgias (right foot).  Skin:       bruising  All other systems reviewed and are negative.     Allergies  Review of patient's allergies indicates no known allergies.  Home Medications   Prior to Admission medications   Medication Sig Start Date End Date Taking? Authorizing Provider  ALPRAZolam Prudy Feeler) 0.5 MG tablet Take 0.5 tablets (0.25 mg total) by mouth as needed. 09/15/13   Quentin Mulling, PA-C  aspirin 81 MG tablet Take 81 mg by mouth daily.    Historical Provider, MD  atenolol (TENORMIN) 100 MG tablet TAKE 1 TABLET BY MOUTH EVERY  DAY FOR BLOOD PRESSURE 04/22/14   Lucky CowboyWilliam McKeown, MD  citalopram (CELEXA) 40 MG tablet TAKE 1 TABLET DAILY 01/15/14   Quentin MullingAmanda Collier, PA-C  diazepam (VALIUM) 5 MG tablet Take 1 tablet (5 mg total) by mouth 2 (two) times daily. 11/30/15   Barrett HenleNicole Elizabeth Darina Hartwell, PA-C  ibuprofen (ADVIL,MOTRIN) 600 MG tablet Take 1 tablet (600 mg total) by mouth every 6 (six) hours as needed. 11/30/15   Barrett HenleNicole Elizabeth Eden Rho, PA-C  IRON CR PO Take by mouth daily.    Historical Provider, MD  ketoconazole (NIZORAL) 2 % cream Apply 1 application topically 2 (two) times daily. 09/15/13   Quentin MullingAmanda Collier, PA-C  Multiple Vitamin (MULTIVITAMINS PO) Take by mouth daily.     Historical Provider, MD  oxyCODONE-acetaminophen (PERCOCET/ROXICET) 5-325 MG tablet Take 2 tablets by mouth every 4 (four) hours as needed for severe pain. 11/30/15   Barrett HenleNicole Elizabeth Georgina Krist, PA-C  rOPINIRole (REQUIP) 1 MG tablet Take 1 tablet (1 mg total) by mouth 3 (three) times daily. 09/15/13   Quentin MullingAmanda Collier, PA-C   BP 139/92 mmHg  Pulse 61  Temp(Src) 97.8 F (36.6 C) (Oral)  Resp 20  SpO2 98%  LMP 11/23/2015 Physical Exam  Constitutional: She is oriented to person, place, and time. She appears well-developed and well-nourished. No distress.  HENT:  Head: Normocephalic and atraumatic. Head is without raccoon's eyes, without Battle's sign, without abrasion, without contusion and without laceration.  Mouth/Throat: Uvula is midline, oropharynx is clear and moist and mucous membranes are normal. No oropharyngeal exudate, posterior oropharyngeal edema, posterior oropharyngeal erythema or tonsillar abscesses.  Eyes: Conjunctivae and EOM are normal. Pupils are equal, round, and reactive to light. Right eye exhibits no discharge. Left eye exhibits no discharge. No scleral icterus.  Neck: Normal range of motion. Neck supple.  Cardiovascular: Normal rate, regular rhythm, normal heart sounds and intact distal pulses.   Pulmonary/Chest: Effort normal and breath sounds normal. No respiratory distress. She has no wheezes. She has no rales. She exhibits no tenderness.  Abdominal: Soft. Bowel sounds are normal. She exhibits no distension and no mass. There is no tenderness. There is no rebound and no guarding.  Musculoskeletal: Normal range of motion. She exhibits no edema.       Right foot: There is tenderness and swelling. There is normal range of motion, normal capillary refill, no crepitus, no deformity and no laceration.       Feet:  No midline C, T tenderness. Midline lumbar tenderness. Full range of motion of neck and dec ROM of back due to reported pain. Full range of motion of bilateral upper and  lower extremities, with 5/5 strength on left slightly dec strength noted to right which pt reports is baseline and unchanged. Sensation intact. 2+ radial and PT pulses. Cap refill <2 seconds.   Mild swelling and ecchymoses noted to right lateral forefoot, TTP. FROM of right knee, ankle and toes. 2+ DP pulse. Sensation grossly intact.   Neurological: She is alert and oriented to person, place, and time.  Skin: Skin is warm and dry. She is not diaphoretic.  Nursing note and vitals reviewed.   ED Course  Procedures (including critical care time) Labs Review Labs Reviewed - No data to display  Imaging Review Dg Lumbar Spine Complete  11/30/2015  CLINICAL DATA:  Pain and spasms in low back X 1 week, patient states she hurt her back when she was younger, Patient has little mobility. Pain in her low back when moving. EXAM: LUMBAR SPINE -  COMPLETE 4+ VIEW COMPARISON:  None. FINDINGS: No fracture.  No spondylolisthesis.  No bone lesion. Disc spaces are relatively well maintained. There are minor endplate osteophytes at L4-L5. Facet joints are well preserved. Soft tissues are unremarkable. IMPRESSION: Normal exam other than minor endplate spurring at L4-L5. Electronically Signed   By: Amie Portland M.D.   On: 11/30/2015 15:29   Dg Ankle Complete Right  11/30/2015  CLINICAL DATA:  Pt fell at home on Tuesday, her right ankle turned and she fell and is having pain on the lateral aspect of her ankle/foot, she also stated having back spasms as well, pt shielded, pt unable to dorsiflex her foot EXAM: RIGHT ANKLE - COMPLETE 3+ VIEW COMPARISON:  None. FINDINGS: No ankle fracture. The ankle mortise is normally spaced and aligned. There is a small avulsion-type fracture from the base of the fifth metatarsal which appears acute. It is nondisplaced. No other fractures. Moderate plantar calcaneal spur. IMPRESSION: 1. Small nondisplaced avulsion fracture from the base of the fifth metatarsal. 2. No other fractures.  No  dislocation. Electronically Signed   By: Amie Portland M.D.   On: 11/30/2015 11:04   I have personally reviewed and evaluated these images and lab results as part of my medical decision-making.  SPLINT APPLICATION Date/Time: 6:02 PM Authorized by: Barrett Henle Consent: Verbal consent obtained. Risks and benefits: risks, benefits and alternatives were discussed Consent given by: patient Splint applied by: orthopedic technician Location details: right foot Splint type: posterior short leg splint Post-procedure: The splinted body part was neurovascularly unchanged following the procedure. Patient tolerance: Patient tolerated the procedure well with no immediate complications.     MDM   Final diagnoses:  Metatarsal fracture, right, closed, initial encounter  Bilateral low back pain without sciatica    Patient presents with worsening back spasms and right foot pain after falling yesterday. Patient reports having chronic back spasms for the past few months which she is being managed by her PCP. She reports being on Xanaflex for her back spasms but notes it makes her loopy resulting in her having a mechanical fall. Denies head injury or LOC. No back pain red flags. VSS. Exam revealed lumbar midline tenderness, mild swelling and ecchymoses to right lateral forefoot with associated tenderness. Right lower extremity neurovascular intact. Patient given pain meds and muscle relaxant and the ED. Right ankle x-ray revealed nondisplaced avulsion fracture of base of fifth metatarsal. Lumbar spine revealed minor endplate spurring at L4-L5, otherwise unremarkable. On reevaluation patient reports her pain has significantly improved. Posterior splint applied to patient's right foot in the ED and advised pt to be non-weight bearing. Pt reports she has an wheelchair at home that she can use due to not being able to use crutches due to her residual right sided weakness from her stroke. Discussed results  and plan for discharge with patient. Patient reports she has no appointment scheduled with an orthopedist in [facility] tomorrow. I advised patient to follow up with orthopedist at her scheduled appointment. Plan to discharge patient home with pain medicine, muscle relaxant and symptomatically treatment. Advised patient to follow up with her PCP within the next week as needed for her chronic back pain. Discussed return precautions with patient.    Satira Sark [place], [place] 11/30/15 1803  Nelva Nay, MD 12/01/15 [phone]

## 2015-11-30 NOTE — ED Notes (Signed)
Placed patient into gown and on the monitor 

## 2015-11-30 NOTE — ED Notes (Signed)
Pt using restroom in waiting room.

## 2015-11-30 NOTE — Discharge Instructions (Signed)
Take your medications as prescribed. I recommend resting, elevating and icing her right foot to help with pain and swelling. Do not bear weight on your right foot until your follow up with orthopedics. You may also apply heat to your back form 15-20 minutes 3-4 times daily to help with your muscle spasms. Follow up with orthopedics at your scheduled appointment tomorrow. Please follow up with a primary care provider from the Resource Guide provided below in 1 week if your back pain has not improved. Please return to the Emergency Department if symptoms worsen or new onset of fever, numbness, tingling, groin numbness, abdominal pain, urinary retention, loss of bowel or bladder, weakness.

## 2015-11-30 NOTE — ED Notes (Signed)
Patient Alert and oriented X4. Stable and ambulatory. Patient verbalized understanding of the discharge instructions.  Patient belongings were taken by the patient.  

## 2015-11-30 NOTE — ED Notes (Signed)
Patient states she has R sided weakness secondary to stroke when she was 45 years old.   Patient states has been taking xanaflex for chronic back pain.  Patient states her back is hurting worse since she fell going to the bed from the doorway.   Patient states she normally walks okay, but the back pain made her fall.  Patient complains of R ankle pain, lower back and back spasms.

## 2015-11-30 NOTE — Progress Notes (Signed)
Orthopedic Tech Progress Note Patient Details:  Reita MayDana J Ericsson 10-29-1970 [id number]  Ortho Devices Type of Ortho Device: Ace wrap, Post (short leg) splint Ortho Device/Splint Location: RLE Ortho Device/Splint Interventions: Ordered, Application   Jennye MoccasinHughes, Joley Utecht Craig 11/30/2015, 5:20 PM

## 2015-12-28 ENCOUNTER — Encounter: Payer: Self-pay | Admitting: Family Medicine

## 2018-04-14 ENCOUNTER — Other Ambulatory Visit: Payer: Self-pay

## 2018-04-14 ENCOUNTER — Encounter (HOSPITAL_COMMUNITY): Payer: Self-pay | Admitting: Emergency Medicine

## 2018-04-14 ENCOUNTER — Emergency Department (HOSPITAL_COMMUNITY)
Admission: EM | Admit: 2018-04-14 | Discharge: 2018-04-15 | Disposition: A | Discharge status: Home / Self Care | Payer: Medicare Other | Attending: Emergency Medicine | Admitting: Emergency Medicine

## 2018-04-14 DIAGNOSIS — Z8673 Personal history of transient ischemic attack (TIA), and cerebral infarction without residual deficits: Secondary | ICD-10-CM | POA: Insufficient documentation

## 2018-04-14 DIAGNOSIS — R51 Headache: Secondary | ICD-10-CM | POA: Insufficient documentation

## 2018-04-14 DIAGNOSIS — Z5321 Procedure and treatment not carried out due to patient leaving prior to being seen by health care provider: Secondary | ICD-10-CM | POA: Insufficient documentation

## 2018-04-14 DIAGNOSIS — I10 Essential (primary) hypertension: Secondary | ICD-10-CM | POA: Insufficient documentation

## 2018-04-14 DIAGNOSIS — R42 Dizziness and giddiness: Secondary | ICD-10-CM | POA: Insufficient documentation

## 2018-04-14 LAB — BASIC METABOLIC PANEL
Anion gap: 7 (ref 5–15)
BUN: 9 mg/dL (ref 6–20)
CALCIUM: 9.3 mg/dL (ref 8.9–10.3)
CHLORIDE: 107 mmol/L (ref 98–111)
CO2: 24 mmol/L (ref 22–32)
Creatinine, Ser: 0.68 mg/dL (ref 0.44–1.00)
GFR calc non Af Amer: >60 mL/min (ref >60)
GLUCOSE: 97 mg/dL (ref 70–99)
Potassium: 4 mmol/L (ref 3.5–5.1)
Sodium: 138 mmol/L (ref 135–145)

## 2018-04-14 LAB — CBC WITH DIFFERENTIAL/PLATELET
Abs Immature Granulocytes: 0.05 10*3/uL (ref 0.00–0.07)
Basophils Absolute: 0.1 10*3/uL (ref 0.0–0.1)
Basophils Relative: 1 %
EOS ABS: 0.2 10*3/uL (ref 0.0–0.5)
EOS PCT: 2 %
HEMATOCRIT: 39.5 % (ref 36.0–46.0)
HEMOGLOBIN: 12.2 g/dL (ref 12.0–15.0)
Immature Granulocytes: 0 %
LYMPHS PCT: 20 %
Lymphs Abs: 2.2 10*3/uL (ref 0.7–4.0)
MCH: 24.6 pg — AB (ref 26.0–34.0)
MCHC: 30.9 g/dL (ref 30.0–36.0)
MCV: 79.6 fL — AB (ref 80.0–100.0)
MONO ABS: 0.7 10*3/uL (ref 0.1–1.0)
Monocytes Relative: 7 %
NEUTROS ABS: 8 10*3/uL — AB (ref 1.7–7.7)
NRBC: 0 % (ref 0.0–0.2)
Neutrophils Relative %: 70 %
Platelets: 376 10*3/uL (ref 150–400)
RBC: 4.96 MIL/uL (ref 3.87–5.11)
RDW: 14.5 % (ref 11.5–15.5)
WBC: 11.4 10*3/uL — ABNORMAL HIGH (ref 4.0–10.5)

## 2018-04-14 LAB — I-STAT TROPONIN, ED: Troponin i, poc: 0 ng/mL (ref 0.00–0.08)

## 2018-04-14 NOTE — ED Triage Notes (Signed)
Pt reports going to doctors yesterday after waking up with a throbbing headache. BP was high at 190/110. Pt reports she takes Atenolol and her dr told her to take two yesterday and today (1100). Pt reports it still did not help. Pt reports it feels like she has someone poking at her chest and a headache. Pt had a stroke when she was 11 and reports they never got the clot out, reports it feels like the clot is throbbing. Pt reports she felt lightheaded and dizzy on her arrival to ER. Headache 7/10 at this time.

## 2018-04-14 NOTE — ED Notes (Signed)
Called to for Pt to room. No answer x3.

## 2018-04-14 NOTE — ED Notes (Signed)
Results reviewed.  No changes in acuity at this time 

## 2018-10-19 ENCOUNTER — Ambulatory Visit: Payer: Medicare Other | Admitting: Neurology

## 2018-12-03 ENCOUNTER — Encounter: Payer: Self-pay | Admitting: *Deleted

## 2018-12-03 ENCOUNTER — Telehealth: Payer: Self-pay | Admitting: *Deleted

## 2018-12-03 NOTE — Telephone Encounter (Signed)
I left patient a message requesting a call back.  We need to update her chart prior to her virtual visit with Dr. Krista Blue.  For now, the chart has been updated by using the limited information received in the paperwork from her PCP.

## 2018-12-08 ENCOUNTER — Other Ambulatory Visit: Payer: Self-pay

## 2018-12-08 ENCOUNTER — Ambulatory Visit (INDEPENDENT_AMBULATORY_CARE_PROVIDER_SITE_OTHER): Payer: Medicare Other | Admitting: Neurology

## 2018-12-08 ENCOUNTER — Encounter: Payer: Self-pay | Admitting: Neurology

## 2018-12-08 DIAGNOSIS — I69959 Hemiplegia and hemiparesis following unspecified cerebrovascular disease affecting unspecified side: Secondary | ICD-10-CM | POA: Diagnosis not present

## 2018-12-08 NOTE — Progress Notes (Signed)
PATIENT: Andrea Cameron DOB: Jun 17, 1971  Virtual Visit via video  I connected with Andrea Cameron on 12/08/18 at  by video and verified that I am speaking with the correct person using two identifiers.   I discussed the limitations, risks, security and privacy concerns of performing an evaluation and management service by video and the availability of in person appointments. I also discussed with the patient that there may be a patient responsible charge related to this service. The patient expressed understanding and agreed to proceed.  HISTORICAL  Andrea Cameron is a 48 year old female, seen in request by her primary care physician Dr. Luciana Axe, Dola Factor for evaluation of spastic right hemiparesis, initial evaluations through virtual visit on December 08, 2018  I have reviewed and summarized the referring note from the referring physician.  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 The patient was provided an opportunity to ask questions and all were answered. The patient agreed with the plan and demonstrated an understanding of the instructions.   The patient was advised to call back or seek an in-person evaluation if the symptoms worsen or if the condition fails to improve as anticipated.  I provided 30 minutes of non-face-to-face time during this encounter.  REVIEW OF SYSTEMS: Full 14 system review of systems performed and notable only for as above All other review of systems were negative.  ALLERGIES: No Known Allergies  HOME MEDICATIONS: Current Outpatient Medications  Medication Sig Dispense Refill  . albuterol (VENTOLIN HFA) 108 (90 Base) MCG/ACT inhaler Inhale into the lungs.    . ALPRAZolam (XANAX) 0.5 MG tablet Take 0.5 tablets (0.25 mg total) by mouth as needed. 60 tablet 0  . amLODipine (NORVASC) 2.5 MG tablet Take by mouth.    Marland Kitchen aspirin 81 MG tablet Take 81 mg by mouth daily.    Marland Kitchen atenolol (TENORMIN) 100 MG tablet TAKE 1 TABLET BY MOUTH EVERY DAY FOR BLOOD PRESSURE 90 tablet 0  . atorvastatin (LIPITOR) 10 MG tablet Take by mouth.    Marland Kitchen rOPINIRole (REQUIP) 1 MG tablet TAKE 1 TABLET BY MOUTH AT BEDTIME     Current Facility-Administered Medications  Medication Dose Route Frequency Provider Last Rate Last Dose  . botulinum toxin Type A (BOTOX) injection 400 Units  400 Units Intramuscular Once Marcial Pacas, MD        PAST MEDICAL HISTORY: Past Medical History:  Diagnosis Date  . Abnormality  of gait   . Anxiety   . 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 CVA (cerebral infarction) 2009  . Hemiplegia affecting dominant side, late effect of cerebrovascular disease   . History of cardiomegaly   .  Hyperlipidemia   . Hypertension   . Prediabetes   . RLS (restless legs syndrome)     PAST SURGICAL HISTORY: Past Surgical History:  Procedure Laterality Date  . CESAREAN SECTION     x3  . TENDON REPAIR     Right arm    FAMILY HISTORY: Family History  Problem Relation Age of Onset  . High blood pressure Other   . Depression Other   . Anxiety disorder Other   . Seizures Other   . Stroke Other     SOCIAL HISTORY:   Social History   Socioeconomic History  . Marital status: Married    Spouse name: Not on file  . Number of children: 3  . Years of education: college  . Highest education level: Not on file  Occupational History    Comment: disabled  Social Needs  . Financial resource strain: Not on file  . Food insecurity    Worry: Not on file    Inability: Not on file  . Transportation needs    Medical: Not on file    Non-medical: Not on file  Tobacco Use  . Smoking status: Never Smoker  . Smokeless tobacco: Never Used  Substance and Sexual Activity  . Alcohol use: Yes    Alcohol/week: 1.0 standard drinks    Types: 1 Glasses of wine per week    Comment: OCC  . Drug use: Yes    Types: Marijuana  . Sexual activity: Not on file  Lifestyle  . Physical activity    Days per week: Not on file    Minutes per session: Not on file  . Stress: Not on file  Relationships  . Social Musicianconnections    Talks on phone: Not on file    Gets together: Not on file    Attends religious service: Not on file    Active member of club or organization: Not on file    Attends meetings of clubs or organizations: Not on file    Relationship status: Not on file  . Intimate partner violence    Fear of current or ex partner: Not on file    Emotionally abused: Not on file    Physically abused: Not on file    Forced sexual activity: Not on file  Other Topics Concern  . Not on file  Social History Narrative   Patient is disabled , she is separated and has three children. Patient has a  college education.   Left handed.    Caffeine- two cups daily.    Levert FeinsteinYijun Gracee Ratterree, M.D. Ph.D.  [facility] [address], [address] [place], [place] [address] Ph: [phone](336) [phone] Fax: [phone]  CC:  Verlon AuBoyd, Tammy Lamonica, MD

## 2018-12-14 ENCOUNTER — Ambulatory Visit: Payer: Self-pay | Admitting: Neurology

## 2019-01-13 ENCOUNTER — Ambulatory Visit (INDEPENDENT_AMBULATORY_CARE_PROVIDER_SITE_OTHER): Payer: Medicare Other | Admitting: Neurology

## 2019-01-13 ENCOUNTER — Encounter: Payer: Self-pay | Admitting: *Deleted

## 2019-01-13 ENCOUNTER — Encounter: Payer: Self-pay | Admitting: Neurology

## 2019-01-13 ENCOUNTER — Other Ambulatory Visit: Payer: Self-pay

## 2019-01-13 VITALS — BP 144/78 | HR 60 | Temp 98.0°F | Ht 64.0 in | Wt 193.5 lb

## 2019-01-13 DIAGNOSIS — I69959 Hemiplegia and hemiparesis following unspecified cerebrovascular disease affecting unspecified side: Secondary | ICD-10-CM

## 2019-01-13 DIAGNOSIS — I69951 Hemiplegia and hemiparesis following unspecified cerebrovascular disease affecting right dominant side: Secondary | ICD-10-CM | POA: Diagnosis not present

## 2019-01-13 DIAGNOSIS — G8111 Spastic hemiplegia affecting right dominant side: Secondary | ICD-10-CM | POA: Insufficient documentation

## 2019-01-13 MED ORDER — INCOBOTULINUMTOXINA 100 UNITS IM SOLR
600.0000 [IU] | INTRAMUSCULAR | Status: DC
  Administered 2019-01-13: 18:00:00 600 [IU] via INTRAMUSCULAR

## 2019-01-13 NOTE — Progress Notes (Signed)
PATIENT: Andrea Cameron DOB: 07/03/1970  Chief Complaint  Patient presents with  . Right Hemiplegia    Xeomin injections - office supply     HISTORICAL  Andrea Cameron is a 48 year old female, seen in request by her primary care physician Dr. Leavy CellaBoyd, Leanora Coverammy Lamonica for evaluation of spastic right hemiparesis, initial evaluations through virtual visit on December 08, 2018  I have reviewed and summarized the referring note from the referring physician.  She has past medical history of hypertension, hyperlipidemia, obesity, suffered left brain stroke at age 89 with residual spastic right hemiparesis, I saw her previously in 2014 for botulism toxin injection, we used 600 units, she responded very well, last injection was in August 2014, she was not able to continue injection due to insurance reasons, now she feels tightness at the right arm, right leg, dragging her right leg while walking, holding the right hand in time in position, she still has 3011 and 46 years old children lives with her, she is independent in daily activity, including house chore, still driving   UPDATE January 13 2019: Patient return for electrical stimulation guided botulism toxin injection, we used Xeomin 600 units.  REVIEW OF SYSTEMS: Full 14 system review of systems performed and notable only for as above All other review of systems were negative.  ALLERGIES: Allergies  Allergen Reactions  . Other Itching    Patient states she itches when given anesthesia from something in the anesthesia    HOME MEDICATIONS: Current Outpatient Medications  Medication Sig Dispense Refill  . albuterol (VENTOLIN HFA) 108 (90 Base) MCG/ACT inhaler Inhale 1 puff into the lungs as needed.     . ALPRAZolam (XANAX) 0.5 MG tablet Take 0.5 tablets (0.25 mg total) by mouth as needed. 60 tablet 0  . amLODipine (NORVASC) 2.5 MG tablet Take by mouth.    Marland Kitchen. aspirin 81 MG tablet Take 81 mg by mouth daily.    Marland Kitchen. atenolol (TENORMIN) 100 MG tablet TAKE  1 TABLET BY MOUTH EVERY DAY FOR BLOOD PRESSURE (Patient taking differently: Take 200 mg by mouth daily. ) 90 tablet 0  . atorvastatin (LIPITOR) 10 MG tablet Take by mouth.    . citalopram (CELEXA) 40 MG tablet Take 1 tablet by mouth daily.    Marland Kitchen. dicyclomine (BENTYL) 10 MG capsule Take by mouth.    . IncobotulinumtoxinA (XEOMIN IM) Inject into the muscle every 3 (three) months.    Marland Kitchen. rOPINIRole (REQUIP) 1 MG tablet TAKE 1 TABLET BY MOUTH AT BEDTIME     No current facility-administered medications for this visit.     PAST MEDICAL HISTORY: Past Medical History:  Diagnosis Date  . Abnormality of gait   . Anxiety   . 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 CVA (cerebral infarction) 2009  . Hemiplegia affecting dominant side, late effect of cerebrovascular disease   . History of cardiomegaly   . Hyperlipidemia   . Hypertension   . Prediabetes   . RLS (restless legs syndrome)     PAST SURGICAL HISTORY: Past Surgical History:  Procedure Laterality Date  . CESAREAN SECTION     x3  . TENDON REPAIR     Right arm    FAMILY HISTORY: Family History  Problem Relation Age of Onset  . High blood pressure Other   . Depression Other   . Anxiety disorder Other   . Seizures Other   .  Stroke Other     SOCIAL HISTORY: Social History   Socioeconomic History  . Marital status: Married    Spouse name: Not on file  . Number of children: 3  . Years of education: college  . Highest education level: Not on file  Occupational History    Comment: disabled  Social Needs  . Financial resource strain: Not on file  . Food insecurity    Worry: Not on file    Inability: Not on file  . Transportation needs    Medical: Not on file    Non-medical: Not on file  Tobacco Use  . Smoking status: Never Smoker  . Smokeless tobacco: Never Used  Substance and Sexual Activity  . Alcohol use: Yes     Alcohol/week: 1.0 standard drinks    Types: 1 Glasses of wine per week    Comment: OCC  . Drug use: Yes    Types: Marijuana  . Sexual activity: Not on file  Lifestyle  . Physical activity    Days per week: Not on file    Minutes per session: Not on file  . Stress: Not on file  Relationships  . Social Herbalist on phone: Not on file    Gets together: Not on file    Attends religious service: Not on file    Active member of club or organization: Not on file    Attends meetings of clubs or organizations: Not on file    Relationship status: Not on file  . Intimate partner violence    Fear of current or ex partner: Not on file    Emotionally abused: Not on file    Physically abused: Not on file    Forced sexual activity: Not on file  Other Topics Concern  . Not on file  Social History Narrative   Patient is disabled , she is separated and has three children. Patient has a college education.   Left handed.    Caffeine- two cups daily.     PHYSICAL EXAM   Vitals:   01/13/19 1346  BP: (!) 144/78  Pulse: 60  Temp: 98 F (36.7 C)  Weight: 193 lb 8 oz (87.8 kg)  Height: 5\' 4"  (1.626 m)    Not recorded      Body mass index is 33.21 kg/m.  PHYSICAL EXAMNIATION:  Gen: NAD, conversant, well nourised, obese, well groomed                     Cardiovascular: Regular rate rhythm, no peripheral edema, warm, nontender. Eyes: Conjunctivae clear without exudates or hemorrhage Neck: Supple, no carotid bruits. Pulmonary: Clear to auscultation bilaterally   NEUROLOGICAL EXAM:  MENTAL STATUS: Speech:    Speech is normal; fluent and spontaneous with normal comprehension.  Cognition:     Orientation to time, place and person     Normal recent and remote memory     Normal Attention span and concentration     Normal Language, naming, repeating,spontaneous speech     Fund of knowledge   CRANIAL NERVES: CN II: Visual fields are full to confrontation.  Pupils are  round equal and briskly reactive to light. CN III, IV, VI: extraocular movement are normal. No ptosis. CN V: Facial sensation is intact to pinprick in all 3 divisions bilaterally. Corneal responses are intact.  CN VII: Face is symmetric with normal eye closure and smile. CN VIII: Hearing is normal to rubbing fingers CN IX, X: Palate elevates  symmetrically. Phonation is normal. CN XI: Head turning and shoulder shrug are intact CN XII: Tongue is midline with normal movements and no atrophy.  MOTOR: Spastic right hemiparesis, right shoulder tightness, tenderness of right pectoralis major, latissimus dorsi upon deep palpitation, tendency to hold right upper extremity shoulder adduction, elbow pronation thumb in position, she has antigravity movement of proximal right upper extremity, with passive movement, right shoulder abduction 80 degrees, forceful forearm pronation, able to near full range of right finger extension, antigravity movement of right lower extremity proximal muscles, tendency for right ankle plantarflexion, toes flexion  REFLEXES: Hyperreflexia of right upper, and lower extremity muscles, right toe extension,  SENSORY: Intact  COORDINATION: Proportional to weakness on right upper and lower extremity  GAIT/STANCE: Spastic right spastic gait, dragging right leg across the floor, forceful right toes flexion   DIAGNOSTIC DATA (LABS, IMAGING, TESTING) - I reviewed patient records, labs, notes, testing and imaging myself where available.   ASSESSMENT AND PLAN  Andrea Cameron is a 48 y.o. 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  Electrical stimulation guided Xeomin injection for spastic right upper and lower extremity muscles, used Xeomin 600 units, 400 units to right upper extremity, 200 units to right lower extremity  Right pectoralis major 50 units Right latissimus dorsi 50 units Right brachialis 50 units Right pronator teres 50 units  Right flexor digitorum profundus 50 units Right flexor digitorum superficialis 50 units Right flexor carpi ulnaris  50 units Right opponens 25 units Right palmar longus 25 units  Right tibialis posterior 50 units Right flexor digitorum longus 75 units Right extensor hallucis longus 25 units Right flexor digitorum brevis 50 units  Patient tolerated injection well, will return to clinic in 3 months for repeat injection  Levert FeinsteinYijun Donelle Hise, M.D. Ph.D.  [facility] [address], [address] [place], [place] [address] Ph: [phone](336) [phone] Fax: [phone]  CC: Referring Provider

## 2019-01-13 NOTE — Progress Notes (Signed)
**  Xeomin 100 unit vials, [place] 0259-1610-01, Lot [id number], Exp 05/2020, office supply.//mck,rn**

## 2019-03-29 ENCOUNTER — Other Ambulatory Visit: Payer: Self-pay

## 2019-03-29 DIAGNOSIS — Z20822 Contact with and (suspected) exposure to covid-19: Secondary | ICD-10-CM

## 2019-03-30 ENCOUNTER — Telehealth: Payer: Self-pay | Admitting: General Practice

## 2019-03-30 LAB — NOVEL CORONAVIRUS, NAA: SARS-CoV-2, NAA: NOT DETECTED

## 2019-03-30 NOTE — Telephone Encounter (Signed)
Negative COVID results given. Patient results "NOT Detected." Caller expressed understanding. ° °

## 2019-04-14 ENCOUNTER — Ambulatory Visit (INDEPENDENT_AMBULATORY_CARE_PROVIDER_SITE_OTHER): Payer: Medicare Other | Admitting: Neurology

## 2019-04-14 ENCOUNTER — Other Ambulatory Visit: Payer: Self-pay

## 2019-04-14 VITALS — BP 137/82 | HR 88 | Temp 97.0°F | Ht 64.0 in | Wt 188.5 lb

## 2019-04-14 DIAGNOSIS — I69951 Hemiplegia and hemiparesis following unspecified cerebrovascular disease affecting right dominant side: Secondary | ICD-10-CM

## 2019-04-14 DIAGNOSIS — I69959 Hemiplegia and hemiparesis following unspecified cerebrovascular disease affecting unspecified side: Secondary | ICD-10-CM

## 2019-04-14 MED ORDER — INCOBOTULINUMTOXINA 100 UNITS IM SOLR
600.0000 [IU] | INTRAMUSCULAR | Status: DC
  Administered 2019-04-14: 14:00:00 600 [IU] via INTRAMUSCULAR

## 2019-04-14 NOTE — Progress Notes (Signed)
PATIENT: Andrea Cameron DOB: 1971/03/20  Chief Complaint  Patient presents with  . Right Spastic Hemiplegia    Xeomin 100 units x 6 vials - office supply     HISTORICAL  Andrea Cameron is a 48 year old female, seen in request by her primary care physician Dr. Leavy CellaBoyd, Leanora Coverammy Lamonica for evaluation of spastic right hemiparesis, initial evaluations through virtual visit on December 08, 2018  I have reviewed and summarized the referring note from the referring physician.  She has past medical history of hypertension, hyperlipidemia, obesity, suffered left brain stroke at age 89 with residual spastic right hemiparesis, I saw her previously in 2014 for botulism toxin injection, we used 600 units, she responded very well, last injection was in August 2014, she was not able to continue injection due to insurance reasons, now she feels tightness at the right arm, right leg, dragging her right leg while walking, holding the right hand in time in position, she still has 7711 and 46 years old children lives with her, she is independent in daily activity, including house chore, still driving   UPDATE January 13 2019: Patient return for electrical stimulation guided botulism toxin injection, we used Xeomin 600 units.  UPDATE Apr 14 2019: She responded very well to previous injection in July 2020, there was no significant side effect noted.  REVIEW OF SYSTEMS: Full 14 system review of systems performed and notable only for as above All other review of systems were negative.  ALLERGIES: Allergies  Allergen Reactions  . Other Itching    Patient states she itches when given anesthesia from something in the anesthesia    HOME MEDICATIONS: Current Outpatient Medications  Medication Sig Dispense Refill  . albuterol (VENTOLIN HFA) 108 (90 Base) MCG/ACT inhaler Inhale 1 puff into the lungs as needed.     . ALPRAZolam (XANAX) 0.5 MG tablet Take 0.5 tablets (0.25 mg total) by mouth as needed. 60 tablet 0  .  amLODipine (NORVASC) 2.5 MG tablet Take by mouth.    Marland Kitchen. aspirin 81 MG tablet Take 81 mg by mouth daily.    Marland Kitchen. atenolol (TENORMIN) 100 MG tablet TAKE 1 TABLET BY MOUTH EVERY DAY FOR BLOOD PRESSURE (Patient taking differently: Take 200 mg by mouth daily. ) 90 tablet 0  . atorvastatin (LIPITOR) 10 MG tablet Take by mouth.    . citalopram (CELEXA) 40 MG tablet Take 1 tablet by mouth daily.    Marland Kitchen. dicyclomine (BENTYL) 10 MG capsule Take by mouth.    . IncobotulinumtoxinA (XEOMIN IM) Inject into the muscle every 3 (three) months.    Marland Kitchen. rOPINIRole (REQUIP) 1 MG tablet TAKE 1 TABLET BY MOUTH AT BEDTIME     Current Facility-Administered Medications  Medication Dose Route Frequency Provider Last Rate Last Dose  . incobotulinumtoxinA (XEOMIN) 100 units injection 600 Units  600 Units Intramuscular Q90 days Levert FeinsteinYan, Noell Shular, MD   600 Units at 01/13/19 1741    PAST MEDICAL HISTORY: Past Medical History:  Diagnosis Date  . Abnormality of gait   . Anxiety   . 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 CVA (cerebral infarction) 2009  . Hemiplegia affecting dominant side, late effect of cerebrovascular disease   . History of cardiomegaly   . Hyperlipidemia   . Hypertension   . Prediabetes   . RLS (restless legs syndrome)     PAST SURGICAL HISTORY: Past Surgical History:  Procedure Laterality Date  . CESAREAN SECTION     x3  . TENDON REPAIR     Right arm    FAMILY HISTORY: Family History  Problem Relation Age of Onset  . High blood pressure Other   . Depression Other   . Anxiety disorder Other   . Seizures Other   . Stroke Other     SOCIAL HISTORY: Social History   Socioeconomic History  . Marital status: Married    Spouse name: Not on file  . Number of children: 3  . Years of education: college  . Highest education level: Not on file  Occupational History    Comment: disabled  Social  Needs  . Financial resource strain: Not on file  . Food insecurity    Worry: Not on file    Inability: Not on file  . Transportation needs    Medical: Not on file    Non-medical: Not on file  Tobacco Use  . Smoking status: Never Smoker  . Smokeless tobacco: Never Used  Substance and Sexual Activity  . Alcohol use: Yes    Alcohol/week: 1.0 standard drinks    Types: 1 Glasses of wine per week    Comment: OCC  . Drug use: Yes    Types: Marijuana  . Sexual activity: Not on file  Lifestyle  . Physical activity    Days per week: Not on file    Minutes per session: Not on file  . Stress: Not on file  Relationships  . Social Musician on phone: Not on file    Gets together: Not on file    Attends religious service: Not on file    Active member of club or organization: Not on file    Attends meetings of clubs or organizations: Not on file    Relationship status: Not on file  . Intimate partner violence    Fear of current or ex partner: Not on file    Emotionally abused: Not on file    Physically abused: Not on file    Forced sexual activity: Not on file  Other Topics Concern  . Not on file  Social History Narrative   Patient is disabled , she is separated and has three children. Patient has a college education.   Left handed.    Caffeine- two cups daily.     PHYSICAL EXAM   Vitals:   04/14/19 1332  BP: 137/82  Pulse: 88  Temp: (!) 97 F (36.1 C)  Weight: 188 lb 8 oz (85.5 kg)  Height: 5\' 4"  (1.626 m)    Not recorded      Body mass index is 32.36 kg/m.  PHYSICAL EXAMNIATION:  Gen: NAD, conversant, well nourised, obese, well groomed                     Cardiovascular: Regular rate rhythm, no peripheral edema, warm, nontender. Eyes: Conjunctivae clear without exudates or hemorrhage Neck: Supple, no carotid bruits. Pulmonary: Clear to auscultation bilaterally   NEUROLOGICAL EXAM:  MOTOR: Spastic right hemiparesis, right shoulder tightness,  tenderness of right pectoralis major, latissimus dorsi upon deep palpitation, tendency to hold right upper extremity shoulder adduction, elbow pronation, thumb in position, she has antigravity movement of proximal right upper extremity, with passive movement, right shoulder abduction 80 degrees, forceful forearm pronation, able to near full range of right finger extension, antigravity movement of right lower extremity proximal muscles, tendency for right ankle plantarflexion, toes flexion  GAIT/STANCE: Spastic right spastic gait, dragging right leg across the floor, forceful right toes flexion   DIAGNOSTIC DATA (LABS, IMAGING, TESTING) - I reviewed patient records, labs, notes, testing and imaging myself where available.   ASSESSMENT AND PLAN  Andrea Cameron is a 48 y.o. 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  Electrical stimulation guided Xeomin injection for spastic right upper and lower extremity muscles, used Xeomin 600 units, 400 units to right upper extremity, 200 units to right lower extremity  Right pectoralis major 50 units Right latissimus dorsi 50 units Right brachialis 100 units Right pronator teres 50 units Right flexor digitorum profundus 50 units Right flexor digitorum superficialis 50 units Right flexor carpi ulnaris  50 units Right opponens 25 units Right palmar longus 25 units  Right tibialis posterior 50 units Right flexor digitorum longus 50 units Right extensor hallucis longus 25 units Right flexor digitorum brevis 25 units  Patient tolerated injection well, will return to clinic in 3 months for repeat injection  Marcial Pacas, M.D. Ph.D.  [facility] [address] [place], [place] [address] Ph: [phone] Fax: [phone]  CC: Referring Provider

## 2019-04-14 NOTE — Progress Notes (Signed)
**  Xeomin 100 units x 6 vials, [place] 0259-1610-01, Lot [id number], Exp 02/2021, office supply.//mck,rn** 

## 2019-07-14 ENCOUNTER — Ambulatory Visit (INDEPENDENT_AMBULATORY_CARE_PROVIDER_SITE_OTHER): Payer: Medicare Other | Admitting: Neurology

## 2019-07-14 ENCOUNTER — Encounter: Payer: Self-pay | Admitting: Neurology

## 2019-07-14 ENCOUNTER — Other Ambulatory Visit: Payer: Self-pay

## 2019-07-14 VITALS — BP 127/70 | HR 77 | Temp 97.5°F | Ht 64.0 in | Wt 184.0 lb

## 2019-07-14 DIAGNOSIS — I69951 Hemiplegia and hemiparesis following unspecified cerebrovascular disease affecting right dominant side: Secondary | ICD-10-CM | POA: Diagnosis not present

## 2019-07-14 DIAGNOSIS — I69959 Hemiplegia and hemiparesis following unspecified cerebrovascular disease affecting unspecified side: Secondary | ICD-10-CM

## 2019-07-14 MED ORDER — INCOBOTULINUMTOXINA 100 UNITS IM SOLR
600.0000 [IU] | INTRAMUSCULAR | Status: DC
  Administered 2019-07-14: 17:00:00 600 [IU] via INTRAMUSCULAR

## 2019-07-14 NOTE — Progress Notes (Signed)
**  Xeomin 200 units x 3 vials, [place] 2179-8102-54, Lot [id number], Exp 07/2021, office supply.//mck,rn**

## 2019-07-14 NOTE — Progress Notes (Signed)
PATIENT: Andrea Cameron DOB: [birth year]  Chief Complaint  Patient presents with  . Right Spastic Hemiplegia    Xeomin 200 units x 3 vials - office supply     HISTORICAL  Andrea Cameron is a 49 year old female, seen in request by her primary care physician Dr. Luciana Axe, Dola Factor for evaluation of spastic right hemiparesis, initial evaluations through virtual visit on December 08, 2018  I have reviewed and summarized the referring note from the referring physician.  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  UPDATE Apr 14 2019: She responded very well to previous injection in July 2020, there was no significant side effect noted.  UpDate July 13, 2018: She responded well to previous injection, complains of worsening right ankle plantarflexion, toes flexion, we injected 300 units of Xeomin to lower extremity, 300 to spastic right upper extremity   REVIEW OF SYSTEMS: Full 14 system review of systems performed and notable only for as above All other review of systems were negative.  ALLERGIES: Allergies  Allergen Reactions  . Other Itching    Patient states she itches when given anesthesia from something in the anesthesia    HOME MEDICATIONS: Current Outpatient Medications  Medication Sig  Dispense Refill  . albuterol (VENTOLIN HFA) 108 (90 Base) MCG/ACT inhaler Inhale 1 puff into the lungs as needed.     . ALPRAZolam (XANAX) 0.5 MG tablet Take 0.5 tablets (0.25 mg total) by mouth as needed. 60 tablet 0  . amLODipine (NORVASC) 2.5 MG tablet Take by mouth.    Marland Kitchen aspirin 81 MG tablet Take 81 mg by mouth daily.    Marland Kitchen atenolol (TENORMIN) 100 MG tablet TAKE 1 TABLET BY MOUTH EVERY DAY FOR BLOOD PRESSURE (Patient taking differently: Take 200 mg by mouth daily. ) 90 tablet 0  . atorvastatin (LIPITOR) 10 MG tablet Take by mouth.    . citalopram (CELEXA) 40 MG tablet Take 1 tablet by mouth daily.    . IncobotulinumtoxinA (XEOMIN IM) Inject 600 Units into the muscle every 3 (three) months.     Marland Kitchen rOPINIRole (REQUIP) 1 MG tablet TAKE 1 TABLET BY MOUTH AT BEDTIME     No current facility-administered medications for this visit.    PAST MEDICAL HISTORY: Past Medical History:  Diagnosis Date  . Abnormality of gait   . Anxiety   . 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 CVA (cerebral infarction) 2009  . Hemiplegia affecting dominant side, late effect of cerebrovascular disease   . History of cardiomegaly   . Hyperlipidemia   . Hypertension   . Prediabetes   . RLS (restless legs syndrome)     PAST SURGICAL HISTORY: Past Surgical History:  Procedure Laterality Date  .  CESAREAN SECTION     x3  . TENDON REPAIR     Right arm    FAMILY HISTORY: Family History  Problem Relation Age of Onset  . High blood pressure Other   . Depression Other   . Anxiety disorder Other   . Seizures Other   . Stroke Other     SOCIAL HISTORY: Social History   Socioeconomic History  . Marital status: Married    Spouse name: Not on file  . Number of children: 3  . Years of education: college  . Highest education level: Not on file  Occupational History    Comment: disabled  Tobacco Use  .  Smoking status: Never Smoker  . Smokeless tobacco: Never Used  Substance and Sexual Activity  . Alcohol use: Yes    Alcohol/week: 1.0 standard drinks    Types: 1 Glasses of wine per week    Comment: OCC  . Drug use: Yes    Types: Marijuana  . Sexual activity: Not on file  Other Topics Concern  . Not on file  Social History Narrative   Patient is disabled , she is separated and has three children. Patient has a college education.   Left handed.    Caffeine- two cups daily.   Social Determinants of Health   Financial Resource Strain:   . Difficulty of Paying Living Expenses: Not on file  Food Insecurity:   . Worried About Programme researcher, broadcasting/film/video in the Last Year: Not on file  . Ran Out of Food in the Last Year: Not on file  Transportation Needs:   . Lack of Transportation (Medical): Not on file  . Lack of Transportation (Non-Medical): Not on file  Physical Activity:   . Days of Exercise per Week: Not on file  . Minutes of Exercise per Session: Not on file  Stress:   . Feeling of Stress : Not on file  Social Connections:   . Frequency of Communication with Friends and Family: Not on file  . Frequency of Social Gatherings with Friends and Family: Not on file  . Attends Religious Services: Not on file  . Active Member of Clubs or Organizations: Not on file  . Attends Banker Meetings: Not on file  . Marital Status: Not on file  Intimate Partner Violence:   . Fear of Current or Ex-Partner: Not on file  . Emotionally Abused: Not on file  . Physically Abused: Not on file  . Sexually Abused: Not on file     PHYSICAL EXAM   Vitals:   07/14/19 1349  BP: 127/70  Pulse: 77  Temp: (!) 97.5 F (36.4 C)  Weight: 184 lb (83.5 kg)  Height: 5\' 4"  (1.626 m)    Not recorded      Body mass index is 31.58 kg/m.  PHYSICAL EXAMNIATION:  Gen: NAD, conversant, well nourised, obese, well groomed                     Cardiovascular: Regular rate rhythm, no peripheral  edema, warm, nontender. Eyes: Conjunctivae clear without exudates or hemorrhage Neck: Supple, no carotid bruits. Pulmonary: Clear to auscultation bilaterally   NEUROLOGICAL EXAM:  MOTOR: Spastic right hemiparesis, right shoulder tightness, tenderness of right pectoralis major, latissimus dorsi upon deep palpitation, tendency to hold right upper extremity shoulder adduction, elbow pronation, thumb in position, she has antigravity movement of proximal right upper extremity, with passive movement, right shoulder abduction 80 degrees, forceful forearm pronation, able to  near full range of right finger extension, antigravity movement of right lower extremity proximal muscles, tendency for right ankle plantarflexion, toes flexion  GAIT/STANCE: Spastic right spastic gait, dragging right leg across the floor, forceful right toes flexion   DIAGNOSTIC DATA (LABS, IMAGING, TESTING) - I reviewed patient records, labs, notes, testing and imaging myself where available.   ASSESSMENT AND PLAN  REIDA HEM is a 49 y.o. 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  Electrical stimulation guided Xeomin injection for spastic right upper and lower extremity muscles, used Xeomin 600 units, 300 units to right upper extremity, 300 units to right lower extremity  Right brachialis 100 units Right pronator teres 50 units Right flexor digitorum profundus 50 units Right flexor digitorum superficialis 50 units Right flexor carpi ulnaris  50 units  Right tibialis posterior 100 units Right flexor digitorum longus 100 units Right extensor hallucis longus 50 units Right flexor digitorum brevis 50 units  Patient tolerated injection well, will return to clinic in 3 months for repeat injection  Levert Feinstein, M.D. Ph.D.  [facility] [address], [address] [place], [place] [address] Ph: [phone] Fax: [phone]  CC: Referring Provider

## 2019-10-13 ENCOUNTER — Encounter: Payer: Self-pay | Admitting: Neurology

## 2019-10-13 ENCOUNTER — Telehealth: Payer: Self-pay | Admitting: *Deleted

## 2019-10-13 ENCOUNTER — Ambulatory Visit: Payer: Self-pay | Admitting: Neurology

## 2019-10-13 NOTE — Telephone Encounter (Signed)
No showed Xeomin appointment. 

## 2020-01-13 ENCOUNTER — Ambulatory Visit: Payer: Medicare Other | Admitting: Neurology

## 2020-03-22 ENCOUNTER — Telehealth: Payer: Self-pay | Admitting: Neurology

## 2020-03-22 NOTE — Telephone Encounter (Signed)
I received a voicemail from patient stating that she is wanting to change doctors because of her experience at her last injection. I called the patient back to follow up. Patient's last injection was in January 2021. The patient states that she experiences jerking in her legs and is worried that she may "kick" Dr. Terrace Arabia while getting her injections. She states she does not want to "hurt" Dr. Terrace Arabia. I advised the patient that Dr. Terrace Arabia is very experienced and understanding, and is aware that patient may experience involuntary movements due to her condition. The patient seemed to understand this as we proceeded to schedule her next Xeomin injection. Patient was also upset that her S/O was told he could not come back to the room with her. I advised the patient that our office and organization have implemented strict visitor policies during the pandemic for patient and employee safety. The patient acknowledged this. FYI

## 2020-04-06 ENCOUNTER — Encounter: Payer: Self-pay | Admitting: Neurology

## 2020-04-06 ENCOUNTER — Ambulatory Visit (INDEPENDENT_AMBULATORY_CARE_PROVIDER_SITE_OTHER): Payer: Medicare Other | Admitting: Neurology

## 2020-04-06 VITALS — BP 168/95 | HR 68 | Ht 64.0 in | Wt 177.5 lb

## 2020-04-06 DIAGNOSIS — I69951 Hemiplegia and hemiparesis following unspecified cerebrovascular disease affecting right dominant side: Secondary | ICD-10-CM

## 2020-04-06 DIAGNOSIS — I69959 Hemiplegia and hemiparesis following unspecified cerebrovascular disease affecting unspecified side: Secondary | ICD-10-CM

## 2020-04-06 MED ORDER — INCOBOTULINUMTOXINA 100 UNITS IM SOLR
600.0000 [IU] | INTRAMUSCULAR | Status: DC
  Administered 2020-04-06: 600 [IU] via INTRAMUSCULAR

## 2020-04-06 NOTE — Progress Notes (Signed)
    PATIENT: Andrea Cameron DOB: 26-Aug-1970  Chief Complaint  Patient presents with  . Right Hemiplegia    Xeomin     HISTORICAL  Andrea Cameron is a 49 year old female, seen in request by her primary care physician Dr. Leavy Cella, Leanora Cover for evaluation of spastic right hemiparesis, initial evaluations through virtual visit on December 08, 2018  I have reviewed and summarized the referring note from the referring physician.  She has past medical history of hypertension, hyperlipidemia, obesity, suffered left brain stroke at age 35 with residual spastic right hemiparesis, I saw her previously in 2014 for botulism toxin injection, we used 600 units, she responded very well, last injection was in August 2014, she was not able to continue injection due to insurance reasons, now she feels tightness at the right arm, right leg, dragging her right leg while walking, holding the right hand in time in position, she still has 67 and 16 years old children lives with her, she is independent in daily activity, including house chore, still driving   UPDATE January 13 2019: Patient return for electrical stimulation guided botulism toxin injection, we used Xeomin 600 units.  UPDATE Apr 14 2019: She responded very well to previous injection in July 2020, there was no significant side effect noted.  UpDate July 13, 2018: She responded well to previous injection, complains of worsening right ankle plantarflexion, toes flexion, we injected 300 units of Xeomin to lower extremity, 300 to spastic right upper extremity  UPDATE Apr 06 2020: She missed a few appointments because family stress, and hysterectomy, but did report significant improvement with previous injection,  REVIEW OF SYSTEMS: Full 14 system review of systems performed and notable only for as above All other review of systems were negative  On Exam:  MOTOR: Spastic right hemiparesis, right shoulder tightness, tenderness of right pectoralis  major, latissimus dorsi upon deep palpitation, tendency to hold right upper extremity shoulder adduction, elbow pronation, thumb in position, she has antigravity movement of proximal right upper extremity, with passive movement, right shoulder abduction 80 degrees, forceful forearm pronation, able to near full range of right finger extension, antigravity movement of right lower extremity proximal muscles, tendency for right ankle plantarflexion, toes flexion  GAIT/STANCE: Spastic right spastic gait, dragging right leg across the floor, forceful right toes flexion   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  Electrical stimulation guided Xeomin injection for spastic right upper and lower extremity muscles, used Xeomin 600 units, 300 units to right upper extremity, 300 units to right lower extremity  Right brachialis 100 units Right pronator teres 50 units Right flexor digitorum profundus 50 units Right flexor digitorum superficialis 50 units Right flexor carpi ulnaris  50 units Right opponent 25 units Right pectoralis major 75 units Right latissimus dorsi 100 units  Right vastus lateralis 100 units    Patient tolerated injection well, will return to clinic in 3 months for repeat injection  Levert Feinstein, M.D. Ph.D.  [facility] [address] [place], [place] [address] Ph: [phone] Fax: [phone]  CC: Referring Provider

## 2020-04-06 NOTE — Progress Notes (Signed)
**  Xeomin 100 units x 5, NDC 0259-1610-01, Lot [id number], Exp 09/2021, Xeomin 50 units x 2, NDC 0259-1605-01. Lot [id number], Exp 09/2021, total of 600 units, office supply.//mck,rn**

## 2020-04-18 ENCOUNTER — Ambulatory Visit: Payer: Medicare Other | Admitting: Neurology

## 2020-07-19 ENCOUNTER — Ambulatory Visit (INDEPENDENT_AMBULATORY_CARE_PROVIDER_SITE_OTHER): Payer: Medicare Other | Admitting: Neurology

## 2020-07-19 ENCOUNTER — Encounter: Payer: Self-pay | Admitting: Neurology

## 2020-07-19 VITALS — BP 127/84 | HR 66 | Ht 64.0 in | Wt 175.0 lb

## 2020-07-19 DIAGNOSIS — I69951 Hemiplegia and hemiparesis following unspecified cerebrovascular disease affecting right dominant side: Secondary | ICD-10-CM

## 2020-07-19 DIAGNOSIS — I69959 Hemiplegia and hemiparesis following unspecified cerebrovascular disease affecting unspecified side: Secondary | ICD-10-CM

## 2020-07-19 MED ORDER — INCOBOTULINUMTOXINA 100 UNITS IM SOLR
600.0000 [IU] | INTRAMUSCULAR | Status: AC
Start: 2020-07-19 — End: ?
  Administered 2020-07-19: 600 [IU] via INTRAMUSCULAR

## 2020-07-19 NOTE — Progress Notes (Signed)
**  Xeomin 100 units x 6 vials, NDC 0259-1610-01, Lot [id number], Exp 03/2022, office supply.//mck,rn** 

## 2020-07-19 NOTE — Progress Notes (Signed)
    PATIENT: Andrea Cameron DOB: July 29, 1970  Chief Complaint  Patient presents with  . Procedure    Right spastic hemiplegia - Xeomin.     HISTORICAL  Andrea Cameron is a 50 year old female, seen in request by her primary care physician Dr. Leavy Cella, Andrea Cameron for evaluation of spastic right hemiparesis, initial evaluations through virtual visit on December 08, 2018  I have reviewed and summarized the referring note from the referring physician.  She has past medical history of hypertension, hyperlipidemia, obesity, suffered left brain stroke at age 34 with residual spastic right hemiparesis, I saw her previously in 2014 for botulism toxin injection, we used 600 units, she responded very well, last injection was in August 2014, she was not able to continue injection due to insurance reasons, now she feels tightness at the right arm, right leg, dragging her right leg while walking, holding the right hand in time in position, she still has 34 and 72 years old children lives with her, she is independent in daily activity, including house chore, still driving   UPDATE January 13 2019: Patient return for electrical stimulation guided botulism toxin injection, we used Xeomin 600 units.  UPDATE Apr 14 2019: She responded very well to previous injection in July 2020, there was no significant side effect noted.  UpDate July 13, 2018: She responded well to previous injection, complains of worsening right ankle plantarflexion, toes flexion, we injected 300 units of Xeomin to lower extremity, 300 to spastic right upper extremity  UPDATE Apr 06 2020: She missed a few appointments because family stress, and hysterectomy, but did report significant improvement with previous injection,  UPDATE Jul 19 2020: Previous injection was very helpful  REVIEW OF SYSTEMS: Full 14 system review of systems performed and notable only for as above All other review of systems were negative  On Exam:  MOTOR: Spastic  right hemiparesis, right shoulder tightness, tenderness of right pectoralis major, latissimus dorsi upon deep palpitation, tendency to hold right upper extremity shoulder adduction, elbow pronation, thumb in position, she has antigravity movement of proximal right upper extremity, with passive movement, right shoulder abduction 80 degrees, forceful forearm pronation, able to near full range of right finger extension, antigravity movement of right lower extremity proximal muscles, tendency for right ankle plantarflexion, toes flexion  GAIT/STANCE: Spastic right spastic gait, dragging right leg across the floor, forceful right toes flexion   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  Electrical stimulation guided Xeomin injection for spastic right upper and lower extremity muscles, used Xeomin 600 units, 300 units to right upper extremity, 300 units to right lower extremity  Right brachialis 100 units Right pronator teres 25 units Right flexor digitorum profundus 50 units Right flexor digitorum superficialis 50 units Right flexor carpi ulnaris  50 units Right pectoralis major 50 units Right latissimus dorsi 100 units  Right flexor digitorum brevis 50 units Right tibialis posterior 50 units Right flexor digitorum longus 50 units Right extensor hallucis longus 25 units    Patient tolerated injection well, will return to clinic in 3 months for repeat injection  Levert Feinstein, M.D. Ph.D.  [facility] [address], [address] [place], [place] [address] Ph: [phone] Fax: [phone]  CC: Referring Provider

## 2020-08-21 ENCOUNTER — Other Ambulatory Visit: Payer: Self-pay | Admitting: Orthopaedic Surgery

## 2020-08-21 DIAGNOSIS — M79671 Pain in right foot: Secondary | ICD-10-CM

## 2020-09-11 ENCOUNTER — Other Ambulatory Visit: Payer: Self-pay

## 2020-09-11 ENCOUNTER — Ambulatory Visit
Admission: RE | Admit: 2020-09-11 | Discharge: 2020-09-11 | Disposition: A | Discharge status: Home / Self Care | Payer: Medicare Other | Source: Ambulatory Visit | Attending: Orthopaedic Surgery | Admitting: Orthopaedic Surgery

## 2020-09-11 DIAGNOSIS — M79671 Pain in right foot: Secondary | ICD-10-CM

## 2020-10-25 ENCOUNTER — Ambulatory Visit: Payer: Medicare Other | Admitting: Neurology

## 2020-11-01 ENCOUNTER — Ambulatory Visit: Payer: Medicare Other | Admitting: Neurology

## 2022-05-29 DIAGNOSIS — Z0289 Encounter for other administrative examinations: Secondary | ICD-10-CM

## 2022-12-02 IMAGING — MR MR FOOT*R* W/O CM
4 of 5 series · 19 of 40 positions shown · non-contrast
Comparison: X-ray 03/15/2020

CLINICAL DATA: Hammertoe

EXAM:
MRI OF THE RIGHT FOREFOOT WITHOUT CONTRAST
TECHNIQUE: Multiplanar, multisequence MR imaging of the right forefoot was
performed. No intravenous contrast was administered.

[Series 5: T1 · oblique · 3.0mm · 0.19mm/px · 3 of 46 slices shown (1 of 2)]
[im 5/46]
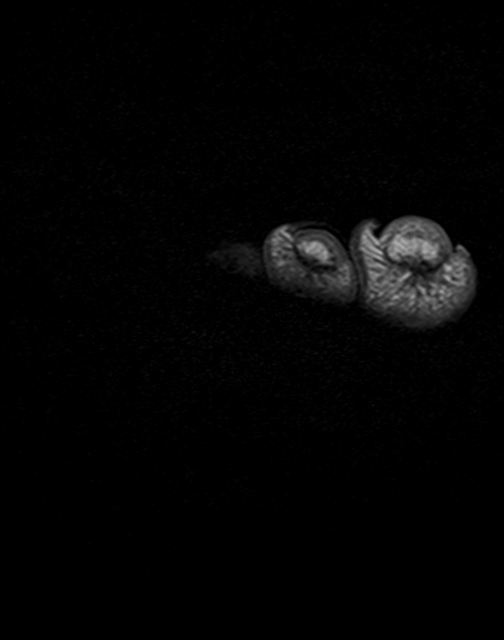
[im 23/46]
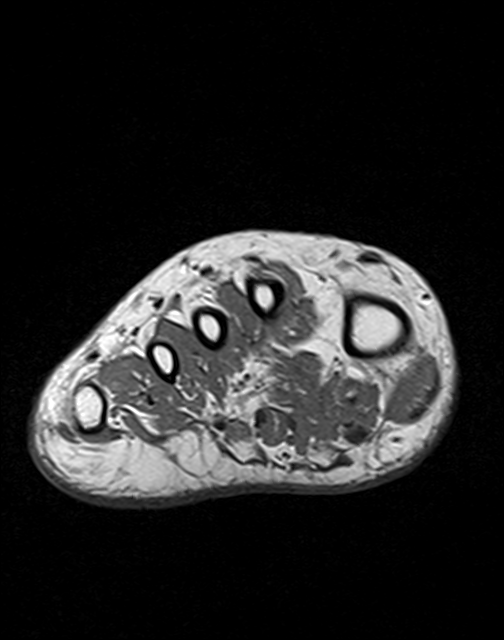
[im 41/46]
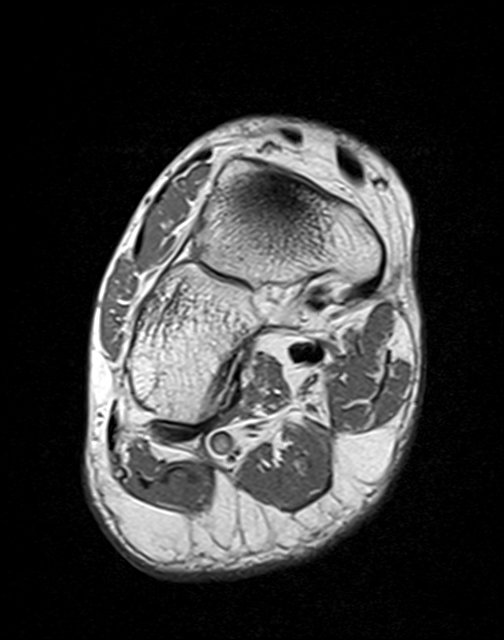

[Series 7: T2 fat-sat · oblique · 3.0mm · 0.19mm/px · 10 of 46 slices shown (1 of 2)]
[im 1/46]
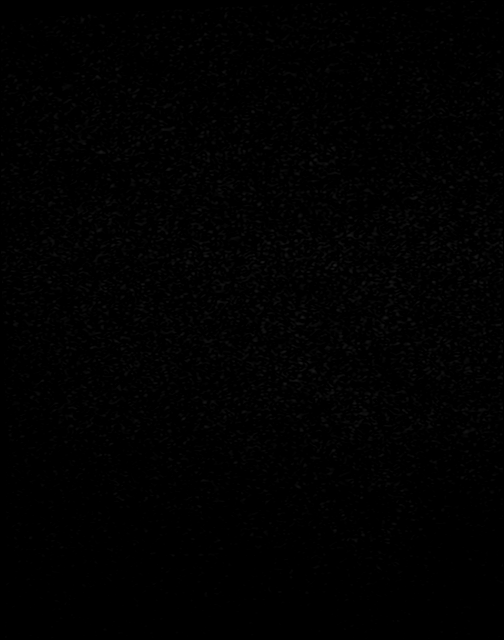
[im 5/46]
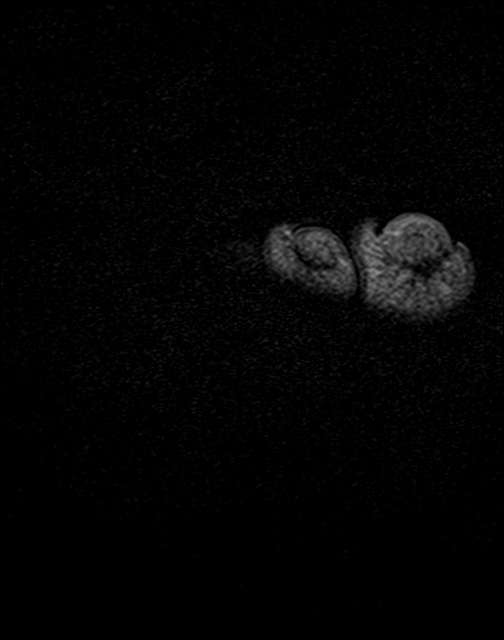
[im 10/46]
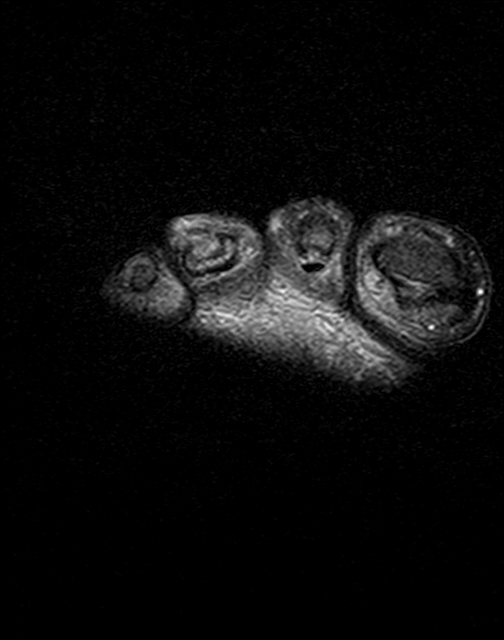
[im 14/46]
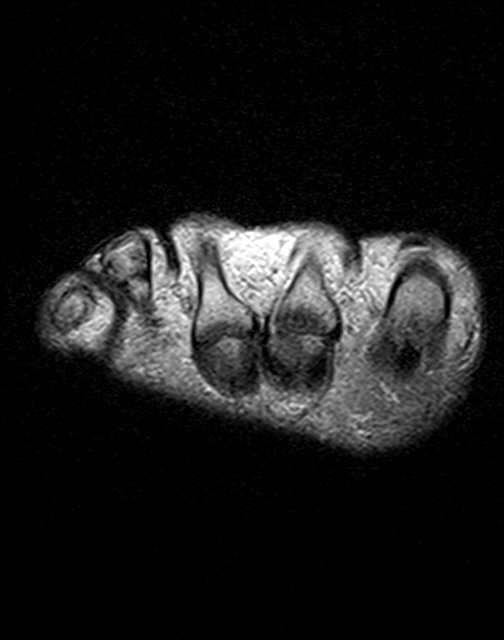
[im 19/46]
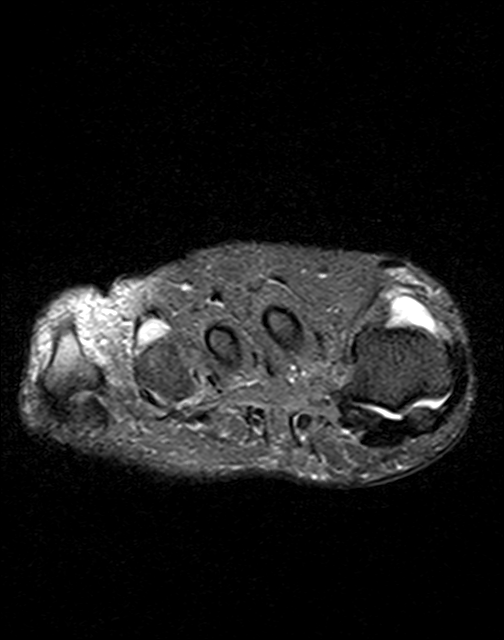
[im 23/46]
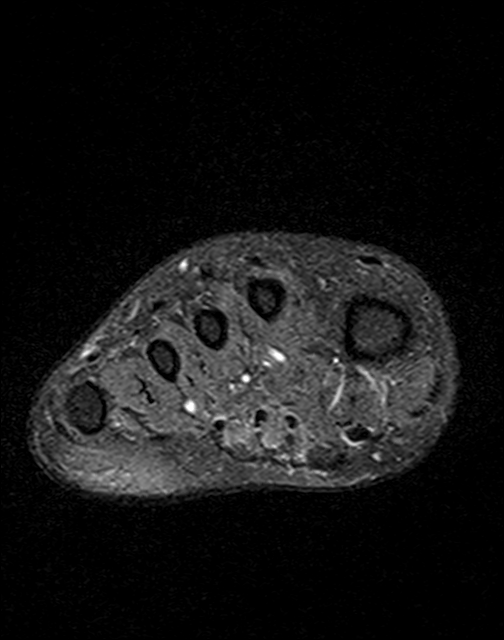
[im 28/46]
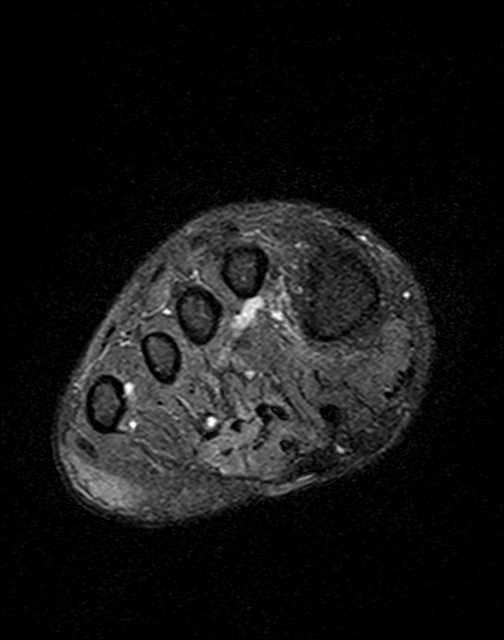
[im 32/46]
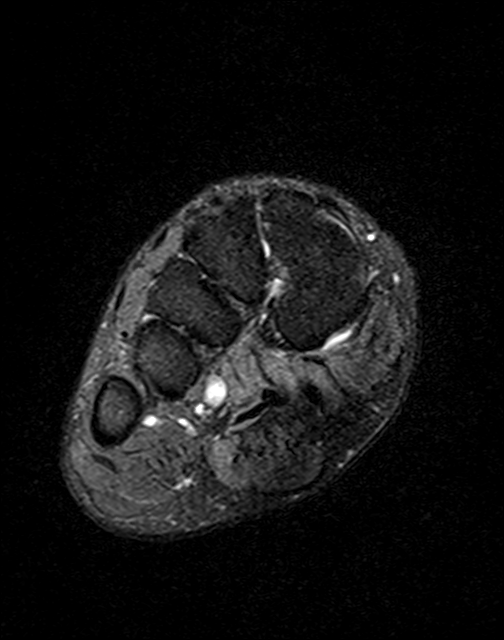
[im 37/46]
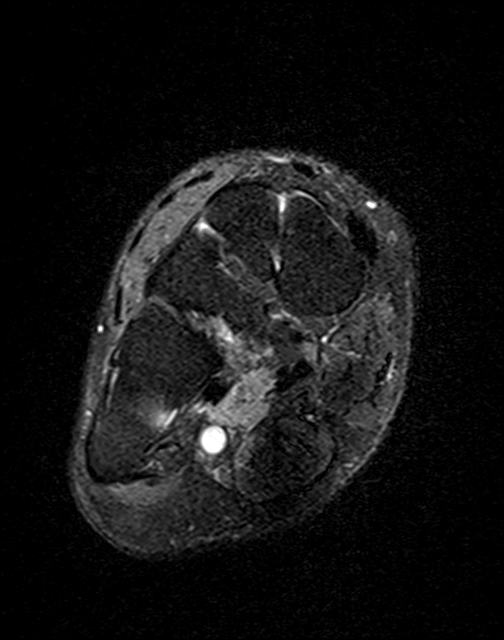
[im 41/46]
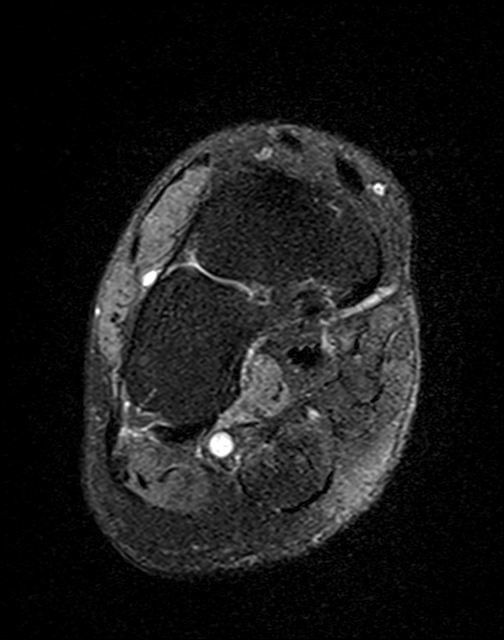

[Series 8: T1 · oblique · 3.0mm · 0.35mm/px · 3 of 22 slices shown (2 of 2)]
[im 1/22]
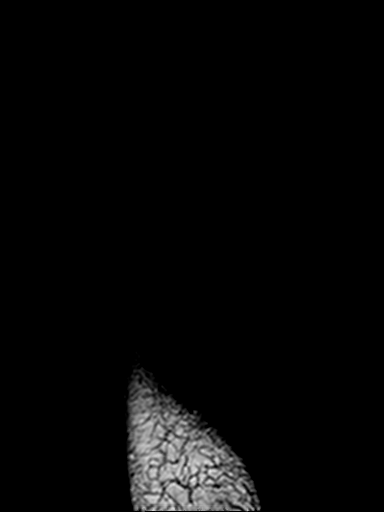
[im 11/22]
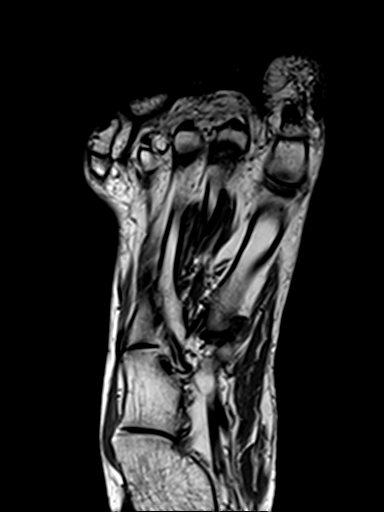
[im 22/22]
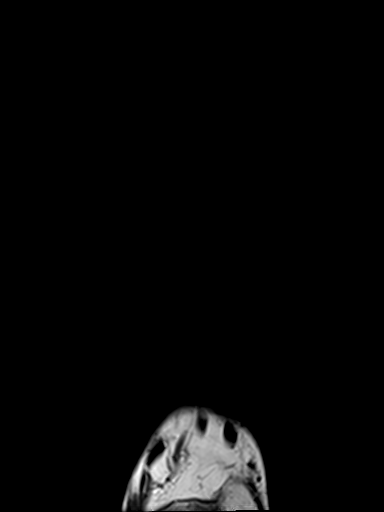

[Series 9: T2 fat-sat · oblique · 3.0mm · 0.35mm/px · 3 of 24 slices shown (2 of 2)]
[im 5/24]
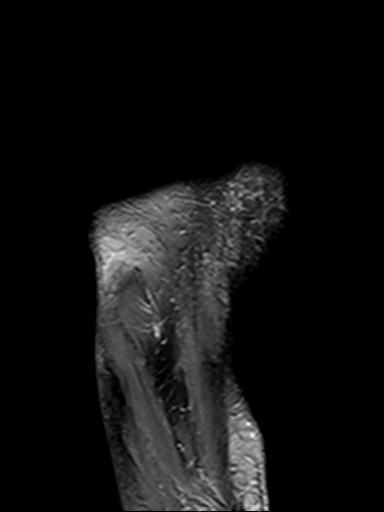
[im 14/24]
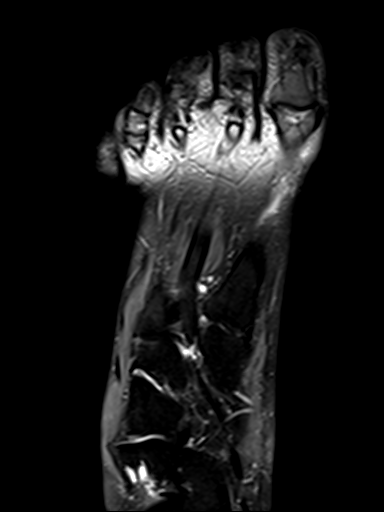
[im 24/24]
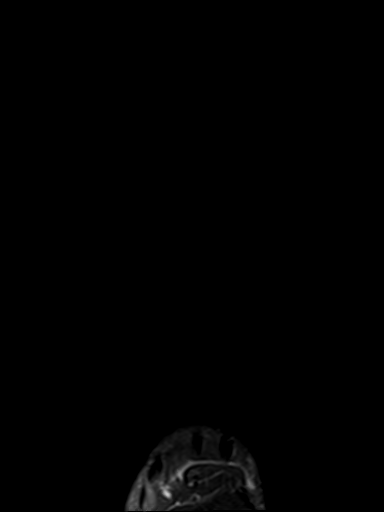

[19 of 40 positions shown; findings below may reference images not displayed]

FINDINGS: Bones/Joint/Cartilage

No acute fracture. No dislocation. Hammertoe deformities of the
second through fifth toes. Mild osteoarthritis throughout the
interphalangeal joints of the right foot. No erosion. Small
effusions involving each of the metatarsophalangeal joints,
nonspecific.

Ligaments

Intact Lisfranc ligament. Collateral ligaments of the forefoot are
intact. No evidence of plantar plate disruption.

Muscles and Tendons

Intact flexor and extensor tendons without tear or tenosynovitis.
Normal muscle bulk and signal intensity without edema, atrophy, or
fatty infiltration.

Soft tissues

Small amount of fluid within the first intermetatarsal space. No
intermetatarsal space mass. Soft tissues are otherwise unremarkable.
No soft tissue edema.
IMPRESSION: 1. Hammertoe deformities of the second through fifth toes. No acute
osseous abnormality.
2. Small amount of fluid within the first intermetatarsal space,
which may reflect mild bursitis.
3. Small effusions involving each of the metatarsophalangeal joints,
nonspecific.
